# Patient Record
Sex: Male | Born: 1940 | Race: White | Hispanic: No | Marital: Married | Smoking: Current every day smoker
Health system: Southern US, Community
[De-identification: ages and names within clinical notes are randomized; demographics above are authoritative.]

## PROBLEM LIST (undated history)

## (undated) DIAGNOSIS — J45909 Unspecified asthma, uncomplicated: Secondary | ICD-10-CM

## (undated) DIAGNOSIS — R972 Elevated prostate specific antigen [PSA]: Secondary | ICD-10-CM

## (undated) DIAGNOSIS — E781 Pure hyperglyceridemia: Secondary | ICD-10-CM

## (undated) DIAGNOSIS — G8929 Other chronic pain: Secondary | ICD-10-CM

## (undated) DIAGNOSIS — G4730 Sleep apnea, unspecified: Secondary | ICD-10-CM

## (undated) DIAGNOSIS — M129 Arthropathy, unspecified: Secondary | ICD-10-CM

## (undated) DIAGNOSIS — I252 Old myocardial infarction: Secondary | ICD-10-CM

## (undated) DIAGNOSIS — I10 Essential (primary) hypertension: Secondary | ICD-10-CM

## (undated) DIAGNOSIS — J449 Chronic obstructive pulmonary disease, unspecified: Secondary | ICD-10-CM

## (undated) DIAGNOSIS — F17210 Nicotine dependence, cigarettes, uncomplicated: Secondary | ICD-10-CM

## (undated) DIAGNOSIS — C61 Malignant neoplasm of prostate: Secondary | ICD-10-CM

## (undated) DIAGNOSIS — M25561 Pain in right knee: Secondary | ICD-10-CM

## (undated) DIAGNOSIS — G4733 Obstructive sleep apnea (adult) (pediatric): Secondary | ICD-10-CM

## (undated) DIAGNOSIS — R918 Other nonspecific abnormal finding of lung field: Secondary | ICD-10-CM

## (undated) DIAGNOSIS — M1990 Unspecified osteoarthritis, unspecified site: Secondary | ICD-10-CM

## (undated) DIAGNOSIS — B029 Zoster without complications: Secondary | ICD-10-CM

## (undated) DIAGNOSIS — I700 Atherosclerosis of aorta: Secondary | ICD-10-CM

## (undated) DIAGNOSIS — M8580 Other specified disorders of bone density and structure, unspecified site: Secondary | ICD-10-CM

## (undated) DIAGNOSIS — I2510 Atherosclerotic heart disease of native coronary artery without angina pectoris: Secondary | ICD-10-CM

## (undated) HISTORY — DX: Zoster without complications: B02.9

## (undated) HISTORY — DX: Arthropathy, unspecified: M12.9

## (undated) HISTORY — DX: Elevated prostate specific antigen (PSA): R97.20

## (undated) HISTORY — PX: APPENDECTOMY: SHX54

## (undated) HISTORY — DX: Atherosclerosis of aorta: I70.0

## (undated) HISTORY — DX: Pure hyperglyceridemia: E78.1

## (undated) HISTORY — DX: Other specified disorders of bone density and structure, unspecified site: M85.80

## (undated) HISTORY — DX: Nicotine dependence, cigarettes, uncomplicated: F17.210

## (undated) HISTORY — DX: Old myocardial infarction: I25.2

## (undated) HISTORY — DX: Other nonspecific abnormal finding of lung field: R91.8

## (undated) HISTORY — DX: Atherosclerotic heart disease of native coronary artery without angina pectoris: I25.10

## (undated) HISTORY — DX: Unspecified osteoarthritis, unspecified site: M19.90

## (undated) HISTORY — DX: Pain in right knee: M25.561

## (undated) HISTORY — DX: Other chronic pain: G89.29

## (undated) HISTORY — DX: Chronic obstructive pulmonary disease, unspecified: J44.9

## (undated) HISTORY — DX: Essential (primary) hypertension: I10

## (undated) HISTORY — DX: Malignant neoplasm of prostate: C61

## (undated) HISTORY — DX: Sleep apnea, unspecified: G47.30

## (undated) HISTORY — DX: Obstructive sleep apnea (adult) (pediatric): G47.33

---

## 1998-06-15 ENCOUNTER — Ambulatory Visit: Admission: RE | Admit: 1998-06-15 | Discharge: 1998-06-15 | Payer: Self-pay | Admitting: Family Medicine

## 2000-08-12 DIAGNOSIS — I2510 Atherosclerotic heart disease of native coronary artery without angina pectoris: Secondary | ICD-10-CM

## 2000-08-12 HISTORY — DX: Atherosclerotic heart disease of native coronary artery without angina pectoris: I25.10

## 2000-09-15 ENCOUNTER — Encounter: Admission: RE | Admit: 2000-09-15 | Discharge: 2000-09-15 | Payer: Self-pay | Admitting: Family Medicine

## 2000-09-15 ENCOUNTER — Encounter: Payer: Self-pay | Admitting: Family Medicine

## 2001-07-12 HISTORY — PX: CORONARY ANGIOPLASTY WITH STENT PLACEMENT: SHX49

## 2001-08-11 ENCOUNTER — Inpatient Hospital Stay (HOSPITAL_COMMUNITY): Admission: EM | Admit: 2001-08-11 | Discharge: 2001-08-15 | Payer: Self-pay | Admitting: Emergency Medicine

## 2001-08-11 ENCOUNTER — Encounter: Payer: Self-pay | Admitting: Emergency Medicine

## 2001-09-01 ENCOUNTER — Encounter (HOSPITAL_COMMUNITY): Admission: RE | Admit: 2001-09-01 | Discharge: 2001-10-16 | Payer: Self-pay | Admitting: Cardiology

## 2008-11-01 ENCOUNTER — Encounter: Payer: Self-pay | Admitting: Cardiology

## 2008-11-01 ENCOUNTER — Ambulatory Visit: Payer: Self-pay | Admitting: Cardiology

## 2008-11-01 DIAGNOSIS — M2550 Pain in unspecified joint: Secondary | ICD-10-CM

## 2008-11-01 DIAGNOSIS — G8929 Other chronic pain: Secondary | ICD-10-CM | POA: Insufficient documentation

## 2008-11-01 DIAGNOSIS — F17200 Nicotine dependence, unspecified, uncomplicated: Secondary | ICD-10-CM

## 2008-11-01 DIAGNOSIS — Z8546 Personal history of malignant neoplasm of prostate: Secondary | ICD-10-CM

## 2008-11-01 DIAGNOSIS — I2510 Atherosclerotic heart disease of native coronary artery without angina pectoris: Secondary | ICD-10-CM | POA: Insufficient documentation

## 2008-11-01 DIAGNOSIS — I10 Essential (primary) hypertension: Secondary | ICD-10-CM

## 2008-11-01 DIAGNOSIS — E785 Hyperlipidemia, unspecified: Secondary | ICD-10-CM

## 2008-11-01 DIAGNOSIS — J449 Chronic obstructive pulmonary disease, unspecified: Secondary | ICD-10-CM

## 2008-11-03 ENCOUNTER — Telehealth (INDEPENDENT_AMBULATORY_CARE_PROVIDER_SITE_OTHER): Payer: Self-pay | Admitting: *Deleted

## 2008-11-07 ENCOUNTER — Ambulatory Visit: Payer: Self-pay

## 2008-11-07 ENCOUNTER — Encounter: Payer: Self-pay | Admitting: Internal Medicine

## 2008-11-21 ENCOUNTER — Telehealth: Payer: Self-pay | Admitting: Cardiology

## 2009-08-18 ENCOUNTER — Encounter (INDEPENDENT_AMBULATORY_CARE_PROVIDER_SITE_OTHER): Payer: Self-pay | Admitting: *Deleted

## 2010-09-11 NOTE — Letter (Signed)
Summary: Appointment - Reminder 2  [facility] [employer], Main Office  [address]   [place], [place] [address]   Phone: [phone]  Fax: [phone]     August 18, 2009 MRN: [id number]   GERMAIN KOOPMANN [address] [place], [place]  [address]   Dear Mr. Belmontes,  Our records indicate that it is time to schedule a follow-up appointment. Dr.Hochrein recommended that you follow up with [place] in March,2011. It is very important that we reach you to schedule this appointment. We look forward to participating in your health care needs. Please contact us at the number listed above at your earliest convenience to schedule your appointment.  If you are unable to make an appointment at this time, give [place] a call so we can update our records.     Sincerely,   Glass blower/designer

## 2010-12-28 NOTE — H&P (Signed)
[place]. [facility]  Patient:    Bruce Gordon, Bruce Gordon Visit Number: [id number] MRN: [id number]          Service Type: MED Location: [facility] 2921 01 Attending Physician:  Corliss Marcus Dictated by:   Francisca December, M.D. Admit Date:  08/11/2001   CC:         Dyanne Carrel, M.D.   History and Physical  REASON FOR ADMISSION:  Acute myocardial infarction.  HISTORY OF PRESENT ILLNESS:  Bruce Gordon is a 70 year old man who developed spontaneous onset of anterior substernal crushing and aching chest pain at around 11 a.m. this morning.  It was associated with perfuse diaphoresis, but no nausea, vomiting, or shortness of breath.  He had a friend drive him to the emergency room where ECG promptly revealed the presence of an acute anterolateral septal wall myocardial infarction.  Bruce Gordon has no previous cardiac history.  He did have chest discomfort which was mild, but similar to todays in location and quality on Sunday.  It lasted one-half hour and then resolved.  He thought it was indigestion.  He does not generally have symptoms of reflux, however.  His cardiac risk factors include age, sex, hypertension, and hyperlipidemia.  PAST MEDICAL HISTORY: 1. Hypertension. 2. Hyperlipidemia.  CURRENT MEDICATIONS:  Lipitor, doxazosin, and HCTZ, ? doses.  DRUG ALLERGIES:  None known.  FAMILY HISTORY:  Not significant for early coronary disease, but is present in family members over the age of 60.  SOCIAL HISTORY:  He works for a [employer].  He smokes cigarettes greater than one pack per day and has for many years.  He drinks no alcohol.  Caffeine intake is not excessive.  REVIEW OF SYSTEMS:  Noncontributory.  PHYSICAL EXAMINATION:  The blood pressure is 150/110, pulse 85 and regular, respiratory rate 16, and temperature afebrile.  GENERAL APPEARANCE:  This is an anxious 70 year old man in no acute distress.  HEENT:  Unremarkable.  The  head is normocephalic and atraumatic.  The pupils are equal, round, and reactive to light and accommodation.  Extraocular movements are intact.  The oral mucosa is pink and moist.  The tongue is not coated.  NECK:  Supple without thyromegaly or masses.  Carotid upstrokes are normal. There is no bruit.  There is no jugular venous distention.  CHEST:  Clear with adequate excursion.  Normal vesicular breath sounds are heard throughout.  HEART:  The precordium is quiet.  Normal S1 and S2.  No S3, murmur, click, or rub.  There is an S4 present.  The PMI is not palpable.  ABDOMEN:  Soft, flat, and nontender.  No hepatosplenomegaly or midline pulsatile mass.  GENITALIA:  Normal male phallus.  Descended testicles.  No lesions.  RECTAL:  Not performed.  EXTREMITIES:  Full range of motion.  No edema.  Intact distal pulses.  NEUROLOGIC:  Cranial nerves II-XII, motor, and sensory are grossly intact. Gait not tested.  SKIN:  Cool, dry, and clear.  LABORATORY DATA:  The electrocardiogram shows acute anteroseptal lateral myocardial infarction with reciprocal inferior changes and sinus rhythm.  ASSESSMENT:  Early into the course of a large anteroseptal lateral wall myocardial infarction.  The patient is hemodynamically stable.  PLAN:  The patient has been counseled on and accepted cardiac catheterization, coronary angiography, and possible PTCA for direct stent/revascularization. The goals and risks were reviewed.  The patient has had his questions answered and wishes to proceed.  He has consented to the Amaryl trial.  In the meantime, I agree with the ER management of IV nitroglycerin, heparin, and aspirin.  We will initiate beta blocker therapy early, as well as ACE inhibitor. Dictated by:   Francisca December, M.D. Attending Physician:  Corliss Marcus DD:  08/11/01 TD:  08/11/01 Job: [id number]

## 2010-12-28 NOTE — Discharge Summary (Signed)
[place]  Patient:    Bruce Gordon, Bruce Gordon Visit Number: [id number] MRN: [id number]          Service Type: MED Location: 2000 2029 01 Attending Physician:  Corliss Marcus Dictated by:   Anselm Lis, N.P. Admit Date:  08/11/2001 Disc. Date: 08/15/01   CC:         Dyanne Carrel, M.D.   Discharge Summary  PRIMARY CARE Danaka Llera:  Dyanne Carrel, M.D.  PROCEDURE:  (August 11, 2001) stent, mid left anterior descending.  Ejection fraction 47% with a large anterior apical wall motion abnormality and mild anterior apical dyskinesis, and mild anterolateral akinesis.  No significant mitral regurgitation.  DISCHARGE DIAGNOSES: 1. Coronary arteriosclerotic heart disease, single vessel:    a. anteroseptal and lateral myocardial infarction with peak CK of 2,424,       MB fraction 395;  enzymes tracking downwards at the time of discharge,    b. (August 11, 2001) percutaneous transluminal coronary angioplasty/stent       mid left anterior descending,    c. ischemic left ventricular dysfunction with ejection fraction of       approximately 33-35% (calculated 97%), large anterior apical wall motion       abnormality, mild anteroapical dyskinesis, anterior/lateral akinesis and       negative significant mitral regurgitation; and,    d. on aspirin, Plavix, beta blocker and ACE inhibitor. 2. Episodic sinus bradycardia/junctional escape rhythm with rate mid    30s to 40s, transient; probably related to post myocardial infarction    conduction disturbance. 3. History of dyslipidemia (on Lipitor prior to admission). 4. Leukocytosis with white blood cells elevated as high as 14.6 (from 7.7),    low-grade temperature initially, then subsequently afebrile, probably    stress response to myocardial infarction. 5. Tobacco abuse; smoking cessation counselling during course of admission;    patient attempting to quit cold [place].  Has had prior  successful attempt    lasting for 2.5 years in the past.  He has smoked one pack/day for 20+    years. 6. Systemic anticoagulation secondary to apical infarction with left    ventricular dysfunction; initiated on Coumadin during course of    admission.  His baseline coagulopathies were within normal range.    He is to use 10 mg of Coumadin on the 2nd and on the 03rd; anticipate    final discharge dosages to be determined by Dr. Katrinka Blazing at the time of    discharge.  He will come to our office on August 17, 2001 for prothrombin    time/INR and subsequent Coumadin adjustment pending those results.  PLAN:  It is anticipated the patient will be discharged on August 15, 2001 if condition stable.  DISCHARGE MEDICATIONS: 1. Aspirin, enteric coated, 82 mg p.o. q.d. (new). 2. Plavix 75 mg 1 p.o. q.d. (new). 3. Lisinopril 10 mg p.o. q.d. (new). 4. Toprol XL 50 mg p.o. q.d.  (new). 5. Warfarin as directed, 20 mg p.o. q.d. (new). 6. Lipitor 10 mg p.o. q.d. or as before. 7. Nitroglycerin tablet 1 sublingual q.5 minutes p.r.n. chest pain; up to    three tabs for 15 minutes (new). 8. Patient to hold doxazosin and hydrochlorothiazide for now.  ACTIVITY:  As per cardiac rehab.  No work for three to four weeks.  No driving.  DIET:  Low-fat, low-cholesterol; fat 40 grams/day or less and cholesterol 300 mg/day.  WOUND CARE:  Patient is to call our clinic if  he develops a large amount of swelling or bruising in the groin area.  SPECIAL INSTRUCTIONS:  Patient to come to our clinic August 17, 2001 for blood tests at 12:45 PM.  He will follow up with Dr. Amil Amen Thursday, August 27, 2001 at noon.  HISTORY OF PRESENT ILLNESS:  Mr. Bruce Gordon is a pleasant 70 year old gentleman with history of hypertension and dyslipidemia as well as tobacco abuse.  He had a spontaneous episode of anterior substernal crushing and aching chest pain at 11 AM the morning of admission, associated with  diffuse diaphoresis, but no nausea or vomiting, or shortness of breath.  A friend drove him to the emergency room where a prompt EKG revealed the presence of an acute anterolateral septal wall myocardial infarction.  He was taken urgently for coronary angiography with the following results: 1. Left ventriculogram:  Large anterior apical wall motion abnormality with    mild anteroapical dyskinesis and anterolateral akinesis.  Calculated    ejection fraction 47%.  Visually it appeared in the range of 30-35%.    Left coronary calcification seen.  No significant mitral regurgitation. 2. Coronary angiography:    a. RCA:  Dominant.  Branches without significant obstruction.  Twenty to       thirty percent mid-to-distal stenosis.  RCA gave rise to moderate size       posterior descending artery and a small posterolateral branch end       segment.    b. Left main:  Normal.    c. Left anterior descending:  LAD and its branches were highly diseased.       Vessel completely occluded in mid portion just after the origin of the       small septal perforator.  Spontaneous reperfusion during angiography       revealing a subtotal stenosis at the origin of the septal perforator and       a thrombus burden within this vessel approximately 2 mm beyond the near       complete occlusion.    d. CFX:  The CFX and its branches without significant obstruction.  Luminal       irregularities. 3. Procedure:  PTCA/stent mid LAD.  COMMENT:  Collateral vessels were seen from the distal PDA to the LAD, but they were quite trivial in nature.  Elevated left ventricular end diastolic pressure of 34 mmHg pre- and post ventriculogram.  Postprocedure patient was stable and remained so during the course of admission.  Peak CK was 2,424, MB fraction 395 and tracking downward serial enzymes.  He did have some episodic transient decreased heart rate (sinus brady/junctional escape rhythm), rate 30s to 40s.  Smoking  cessation  counselling was initiated.  Patient is hoping to quit "cold [place]."  He was started on Coumadin for anteroapical LV dysfunction.  During the course of admission he tolerated ambulation without complaint of chest pain nor shortness of breath.  It is anticipated he will follow up in cardiac rehab as an outpatient.  LABORATORY TESTS:  First CK of 142, MB fraction 4 and troponin I 0.16.  Second CK of 2,424, MB fraction 395.  Third CK of 1,678 with MB fraction 212.  Fourth CK of 975 with MB fraction 107.  Admission chest x-ray revealed mildly accentuated bronchial markings and no active disease.  Sodium 137, K 3.4 subsequently 3.3 and on January 01st was 3.9, chloride 106, CO2 26, glucose 109, BUN of 14, creatinine 1.1.  LFTs all within normal range.  WBC of  7.7 subsequently 14.6, hemoglobin of 16.1 subsequently 14.3, hematocrit of 46.7 subsequently 41.5, and platelets of 242,000.  Admission coags were within normal range.  Admission EKG revealed acute anteroseptal lateral myocardial infarction with reciprocal inferior changes and sinus rhythm. Dictated by:   Anselm Lis, N.P. Attending Physician:  Corliss Marcus DD:  08/14/01 TD:  08/14/01 Job: [id number]

## 2010-12-28 NOTE — Cardiovascular Report (Signed)
[place]. [facility]  Patient:    Bruce Gordon, Bruce Gordon Visit Number: [id number] MRN: [id number]          Service Type: MED Location: [facility] 2921 01 Attending Physician:  Corliss Marcus Dictated by:   Francisca December, M.D. Proc. Date: 08/11/01 Admit Date:  08/11/2001   CC:         Dyanne Carrel, M.D.  Cardiac Catheterization Laboratory   Cardiac Catheterization  PROCEDURES PERFORMED: 1. Left heart catheterization. 2. Coronary angiography. 3. Left ventriculogram. 4. Percutaneous transluminal coronary angiography and stent implantation to    mid left anterior descending artery.  INDICATIONS:  Bruce Gordon is a 70 year old man who is now 1-1/2 to 2 hours into the course of an acute anterolateral wall myocardial infarction.  He is brought to the cardiac catheterization laboratory for possible percutaneous intervention.  PROCEDURAL NOTE:  Left heart catheterization was performed following the percutaneous insertion of a 7-French catheter sheath utilizing an anterior approach over a guiding J-wire into the right femoral artery.  A 110 cm pigtail catheter was used to measure pressures in the ascending aorta and in the left ventricle both prior to and following the ventriculogram.  A 30 degree RAO cine left ventriculogram was performed utilizing a power injector. Coronary angiography was then performed using 6-French #4 left and right Judkins catheters.  Cineangiography of each coronary artery was conducted in multiple LAO and RAO projections.  At the completion of the procedure, we made preparations for percutaneous coronary intervention.  INTERVENTIONAL PROCEDURE:  The 6-French left Judkins catheter was exchanged for a 7-French #4 FL guiding catheter.  This was advanced to the ascending aorta where the left coronary os was engaged.  An ACT was obtained and found to be 212 seconds.  The patient received 2000 units of additional heparin.  He also  received a bolus of ReoPro and constant infusion.  A 0.014 inch Sci-Med luge intracoronary guide wire was passed across the lesion in the mid LAD with minimal difficulty.  Initial stent implantation was performed utilizing a 3.5 x 16 mm Sci-Med Express-II intracoronary stent. This was carefully positioned across the lesion in the mid LAD.  It was deployed there at a peak pressure of 20 atm for one minute.  The balloon was then removed.  Cineangiography repeated and a 4.0 x 10 mm Sci-Med [place] Ranger intracoronary balloon was then advanced into the mid portion of the stent.  It was inflated there to a peak pressure of 18 atm for 52 seconds.  Following confirmation of adequate patency in orthogonal views both with and without the guide wire in place, the guiding catheter was removed.  Hemostasis was achieved by suturing the sheath in place.  The patient was transported to the recovery area in stable condition with an intact distal pulse.  ACT at closure was 254 seconds.  HEMODYNAMICS:  Systemic arterial pressure was 165/110 with a mean of 137 mmHg. There was no systolic gradient across the aortic valve.  The left ventricular end-diastolic pressure was 34 mmHg pre and post ventriculogram.  The patient did receive 10 mg of IV Metoprolol during the procedure and his blood pressure fell at completion to 130/90 mmHg with a heart rate of 75.  ANGIOGRAPHY:  The left ventriculogram demonstrated a large anteroapical wall motion abnormality with mild anteroapical dyskinesis and anterolateral akinesis.  The calculated ejection fraction was 47%.  Visually, it appeared in the range of 30-35%.  Left coronary calcification was seen.  There was no  significant mitral regurgitation.  There was a right dominant coronary system present.  The main left coronary artery was normal.  The left anterior descending artery and its branches were highly diseased. The vessel was completely occluded in the mid portion  just after the origin of a small septal perforator.  There was spontaneous reperfusion during angiography and this revealed a subtotal stenosis at the origin of the septal perforator and a thrombus burden within the vessel approximately 2 mm beyond the near complete occlusion.  There was TIMI-1 flow at this point.  The left circumflex coronary artery and its branches were without significant obstruction.  There were luminal irregularities.  The right coronary artery and its branches were without significant obstruction.  There was a 20-30% mid to distal stenosis.  It gave rise to a moderate sized posterior descending artery and a small posterolateral branch and segment.  Collateral vessels were seen from the distal PDA to the LAD, but these were quite trivial in nature.  Following balloon dilatation and stent implantation, there was no residual stenosis in the LAD and TIMI grade flow was II.  FINAL IMPRESSIONS: 1. Atherosclerotic coronary vascular disease, single-vessel. 2. Moderately diminished left ventricular systolic function with regional    wall motion abnormality, as noted. 3. Status post successful percutaneous transluminal coronary angiography and    stent implantation to mid left anterior descending artery. 4. Systemic hypertension. 5. Elevated left ventricular end-diastolic pressure. Dictated by:   Francisca December, M.D. Attending Physician:  Corliss Marcus DD:  08/11/01 TD:  08/11/01 Job: [id number]

## 2014-03-25 ENCOUNTER — Ambulatory Visit: Payer: Self-pay | Admitting: Family Medicine

## 2014-06-20 ENCOUNTER — Ambulatory Visit: Payer: Self-pay | Admitting: Radiation Oncology

## 2014-07-12 ENCOUNTER — Ambulatory Visit: Payer: Self-pay | Admitting: Radiation Oncology

## 2014-08-03 LAB — CBC CANCER CENTER
Basophil #: 0 x10 3/mm (ref 0.0–0.1)
Basophil %: 0.3 %
Eosinophil #: 0.1 x10 3/mm (ref 0.0–0.7)
Eosinophil %: 1.4 %
HCT: 46.3 % (ref 40.0–52.0)
HGB: 15.6 g/dL (ref 13.0–18.0)
Lymphocyte #: 2.1 x10 3/mm (ref 1.0–3.6)
Lymphocyte %: 24.3 %
MCH: 33.2 pg (ref 26.0–34.0)
MCHC: 33.6 g/dL (ref 32.0–36.0)
MCV: 99 fL (ref 80–100)
Monocyte #: 1.1 x10 3/mm — ABNORMAL HIGH (ref 0.2–1.0)
Monocyte %: 13 %
Neutrophil #: 5.2 x10 3/mm (ref 1.4–6.5)
Neutrophil %: 61 %
Platelet: 168 x10 3/mm (ref 150–440)
RBC: 4.68 10*6/uL (ref 4.40–5.90)
RDW: 14.2 % (ref 11.5–14.5)
WBC: 8.5 x10 3/mm (ref 3.8–10.6)

## 2014-08-10 LAB — CBC CANCER CENTER
Basophil #: 0 x10 3/mm (ref 0.0–0.1)
Basophil %: 0.3 %
Eosinophil #: 0.3 x10 3/mm (ref 0.0–0.7)
Eosinophil %: 3.5 %
HCT: 46.1 % (ref 40.0–52.0)
HGB: 15.7 g/dL (ref 13.0–18.0)
Lymphocyte #: 1.5 x10 3/mm (ref 1.0–3.6)
Lymphocyte %: 19.2 %
MCH: 33.6 pg (ref 26.0–34.0)
MCHC: 34 g/dL (ref 32.0–36.0)
MCV: 99 fL (ref 80–100)
Monocyte #: 0.9 x10 3/mm (ref 0.2–1.0)
Monocyte %: 12 %
Neutrophil #: 5 x10 3/mm (ref 1.4–6.5)
Neutrophil %: 65 %
Platelet: 202 x10 3/mm (ref 150–440)
RBC: 4.67 10*6/uL (ref 4.40–5.90)
RDW: 14.5 % (ref 11.5–14.5)
WBC: 7.6 x10 3/mm (ref 3.8–10.6)

## 2014-08-12 ENCOUNTER — Ambulatory Visit: Payer: Self-pay | Admitting: Radiation Oncology

## 2014-08-17 LAB — CBC CANCER CENTER
Basophil #: 0 x10 3/mm (ref 0.0–0.1)
Basophil %: 0.4 %
Eosinophil #: 0.3 x10 3/mm (ref 0.0–0.7)
Eosinophil %: 4.4 %
HCT: 46.2 % (ref 40.0–52.0)
HGB: 15.6 g/dL (ref 13.0–18.0)
Lymphocyte #: 1.5 x10 3/mm (ref 1.0–3.6)
Lymphocyte %: 25.7 %
MCH: 33.6 pg (ref 26.0–34.0)
MCHC: 33.8 g/dL (ref 32.0–36.0)
MCV: 99 fL (ref 80–100)
Monocyte #: 0.7 x10 3/mm (ref 0.2–1.0)
Monocyte %: 12.8 %
Neutrophil #: 3.3 x10 3/mm (ref 1.4–6.5)
Neutrophil %: 56.7 %
Platelet: 211 x10 3/mm (ref 150–440)
RBC: 4.66 10*6/uL (ref 4.40–5.90)
RDW: 14.7 % — ABNORMAL HIGH (ref 11.5–14.5)
WBC: 5.7 x10 3/mm (ref 3.8–10.6)

## 2014-08-24 LAB — CBC CANCER CENTER
Basophil #: 0 x10 3/mm (ref 0.0–0.1)
Basophil %: 0.6 %
Eosinophil #: 0.3 x10 3/mm (ref 0.0–0.7)
Eosinophil %: 4.4 %
HCT: 47.3 % (ref 40.0–52.0)
HGB: 15.9 g/dL (ref 13.0–18.0)
Lymphocyte #: 1.4 x10 3/mm (ref 1.0–3.6)
Lymphocyte %: 23.9 %
MCH: 33.4 pg (ref 26.0–34.0)
MCHC: 33.7 g/dL (ref 32.0–36.0)
MCV: 99 fL (ref 80–100)
Monocyte #: 0.8 x10 3/mm (ref 0.2–1.0)
Monocyte %: 14.4 %
Neutrophil #: 3.3 x10 3/mm (ref 1.4–6.5)
Neutrophil %: 56.7 %
Platelet: 190 x10 3/mm (ref 150–440)
RBC: 4.76 10*6/uL (ref 4.40–5.90)
RDW: 14.5 % (ref 11.5–14.5)
WBC: 5.8 x10 3/mm (ref 3.8–10.6)

## 2014-08-31 LAB — CBC CANCER CENTER
Basophil #: 0 x10 3/mm (ref 0.0–0.1)
Basophil %: 0.4 %
Eosinophil #: 0.2 x10 3/mm (ref 0.0–0.7)
Eosinophil %: 3.7 %
HCT: 48.5 % (ref 40.0–52.0)
HGB: 16.3 g/dL (ref 13.0–18.0)
Lymphocyte #: 1.5 x10 3/mm (ref 1.0–3.6)
Lymphocyte %: 23 %
MCH: 33.4 pg (ref 26.0–34.0)
MCHC: 33.7 g/dL (ref 32.0–36.0)
MCV: 99 fL (ref 80–100)
Monocyte #: 1 x10 3/mm (ref 0.2–1.0)
Monocyte %: 14.8 %
Neutrophil #: 3.8 x10 3/mm (ref 1.4–6.5)
Neutrophil %: 58.1 %
Platelet: 182 x10 3/mm (ref 150–440)
RBC: 4.89 10*6/uL (ref 4.40–5.90)
RDW: 14.6 % — ABNORMAL HIGH (ref 11.5–14.5)
WBC: 6.6 x10 3/mm (ref 3.8–10.6)

## 2014-09-07 LAB — CBC CANCER CENTER
Basophil #: 0 x10 3/mm (ref 0.0–0.1)
Basophil %: 0.5 %
Eosinophil #: 0.3 x10 3/mm (ref 0.0–0.7)
Eosinophil %: 5 %
HCT: 44.3 % (ref 40.0–52.0)
HGB: 15 g/dL (ref 13.0–18.0)
Lymphocyte #: 1.3 x10 3/mm (ref 1.0–3.6)
Lymphocyte %: 24.4 %
MCH: 33.8 pg (ref 26.0–34.0)
MCHC: 33.8 g/dL (ref 32.0–36.0)
MCV: 100 fL (ref 80–100)
Monocyte #: 0.8 x10 3/mm (ref 0.2–1.0)
Monocyte %: 15.1 %
Neutrophil #: 2.9 x10 3/mm (ref 1.4–6.5)
Neutrophil %: 55 %
Platelet: 164 x10 3/mm (ref 150–440)
RBC: 4.44 10*6/uL (ref 4.40–5.90)
RDW: 15.1 % — ABNORMAL HIGH (ref 11.5–14.5)
WBC: 5.3 x10 3/mm (ref 3.8–10.6)

## 2014-09-12 ENCOUNTER — Ambulatory Visit: Payer: Self-pay | Admitting: Radiation Oncology

## 2014-10-11 ENCOUNTER — Ambulatory Visit
Admit: 2014-10-11 | Disposition: A | Discharge status: Home / Self Care | Payer: Self-pay | Attending: Radiation Oncology | Admitting: Radiation Oncology

## 2014-12-03 NOTE — Consult Note (Signed)
Reason for Visit: This 74 year old Male patient presents to the clinic for initial evaluation of  prostate cancer .   Referred by Dr. Erlene Quan.  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  Pathology Report pathology report reviewed   Imaging Report based on low PSA bone scan not indicated   Referral Report clinical notes reviewed   Planned Treatment Regimen IMRTimage guided radiation therapy   HPI   patient's a pleasant 74 year old male followed by urology for slightly elevated PSA of 5.3. He was noted to have induration of his right lateral lobe of his prostate with a 36 ml prostate volume. Patient underwent transrectal ultrasound-guided biopsy showing 3 core biopsies positive in the right apex and right lateral gland for mostly Gleason 7 (3+4) and Gleason 6 (3+3) adenocarcinoma. He has very little urinary symptoms has nocturia 1Pacific urgency or frequency. 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 He specifically denies bone pain hematuria or incontinence. Treatment options have been discussed with the patient. He has multiple comorbidities including coronary artery disease with stents placed prior myocardial infarction. He is seen today for treatment options.  Past Hx:    Arthritis:    CAD:    Prostate Cancer:    MI - Myocardial Infarct:    HTN:    Hyperlipidemia:   Past, Family and Social History:  Past Medical History positive   Cardiovascular coronary artery disease; hyperlipidemia; hypertension; myocardial infarction   Past Medical History Comments arthritis   Family History positive   Family History Comments father with prostate cancer   Social History positive   Social History Comments 40-pack-year smoking history continues to smoke a pack a day no EtOH abuse history   Additional Past Medical and Surgical History accompanied by his wife today    Allergies:   No Known Allergies:   Home Meds:  Home Medications: Medication Instructions Status  Plavix 75 mg oral tablet 1 tab(s) orally once a day Active  Niaspan ER 1000 mg oral tablet, extended release 1 tab(s) orally once a day (at bedtime) Active  Metoprolol Tartrate 100 mg oral tablet 1 tab(s) orally 2 times a day Active  aspirin 81 mg oral tablet 1 tab(s) orally once a day Active  Mobic 15 mg oral tablet 1 tab(s) orally once a day Active  Tylenol 325 mg oral tablet 1 tab(s) orally once a day Active  amLODIPine 5 mg oral tablet 1 tab(s) orally once a day Active  Crestor 10 mg oral tablet 1 tab(s) orally once a day (at bedtime) Active  KlonoPIN 1 mg oral tablet 1 tab(s) orally once a day Active  lisinopril 40 mg oral tablet 1 tab(s) orally once a day Active  acetaminophen-oxyCODONE 300 mg-5 mg oral tablet 1 tab(s) orally every 6 hours, As Needed Active  Spiriva Respimat 2.5 mcg/inh inhalation aerosol 2 puff(s) inhaled once a day Active   Review of Systems:  General negative   Performance Status (ECOG) 0   Skin negative   Breast negative   Ophthalmologic negative   ENMT negative   Respiratory and Thorax negative   Cardiovascular negative   Gastrointestinal negative   Genitourinary negative   Musculoskeletal negative   Neurological negative   Psychiatric negative   Hematology/Lymphatics negative   Endocrine negative   Allergic/Immunologic negative   Review of Systems   denies any weight loss, fatigue, weakness, fever, chills or night sweats. Patient  denies any loss of vision, blurred vision. Patient denies any ringing  of the ears or hearing loss. No irregular heartbeat. Patient denies heart murmur or history of fainting. Patient denies any chest pain or pain radiating to her upper extremities. Patient denies any shortness of breath, difficulty breathing at night, cough or hemoptysis. Patient denies any swelling in the lower legs. Patient denies any  nausea vomiting, vomiting of blood, or coffee ground material in the vomitus. Patient denies any stomach pain. Patient states has had normal bowel movements no significant constipation or diarrhea. Patient denies any dysuria, hematuria or significant nocturia. Patient denies any problems walking, swelling in the joints or loss of balance. Patient denies any skin changes, loss of hair or loss of weight. Patient denies any excessive worrying or anxiety or significant depression. Patient denies any problems with insomnia. Patient denies excessive thirst, polyuria, polydipsia. Patient denies any swollen glands, patient denies easy bruising or easy bleeding. Patient denies any recent infections, allergies or URI. Patient "s visual fields have not changed significantly in recent time.   Nursing Notes:  Nursing Vital Signs and Chemo Nursing Nursing Notes: *CC Vital Signs Flowsheet:   09-Nov-15 13:38  Temp Temperature 96.7  Pulse Pulse 87  Respirations Respirations 20  SBP SBP 152  DBP DBP 90  Pain Scale (0-10)  0  Current Weight (kg) (kg) 85.8   Physical Exam:  General/Skin/HEENT:  General normal   Skin normal   Eyes normal   ENMT normal   Head and Neck normal   Additional PE well-developed male in NAD. Lungs are clear to A&P cardiac examination shows regular rate and rhythm abdomen is benign. On rectal exam rectal sphincter tone is good. There is some asymmetry to his prostate with firmness in the right lateral lobe although the sulcus is preserved bilaterally. Seminal vesicle regions appear within normal limits. No other rectal abnormality is identified.   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 Plan:   at this time of gone over treatment options with the patient including surgical resection versus seed implantation versus external beam IM RT radiation. Patient is in favor of external beam treatment. I will plan on delivering 8000 cGy over 8 weeks to his prostate. I have asked Dr. Erlene Quan to place gold fiduciary markers in his prostate for daily image guided treatment. Shortly after marker placement I have set him up for CT simulation. Risks and benefits of treatment including increased urinary frequency urgency nocturia, possible diarrhea, possible alteration of blood counts, possible fatigue or were explained in detail to the patient. He seems to comprehend my treatment plan well. I have arrange for simulation as well as marker placement. Patient knows to call with any concerns in the near future.  I would like to take this opportunity for allowing me to participate in the care of your patient..  Fax to Physician:  [facility] Fax: Bobetta Lime, MD - [phone] Sherlynn Stalls - [phone].  Electronic Signatures: Armstead Peaks (MD)  (Signed [phone] 15:20)  Authored: HPI, Diagnosis, Past Hx, PFSH, Allergies, Home Meds, ROS, Nursing Notes, Physical Exam, Encounter Assessment and Plan, Fax to Physician   Last Updated: 09-Nov-15 15:20 by  Jalina Blowers, Roda Shutters (MD)

## 2015-01-17 ENCOUNTER — Other Ambulatory Visit: Payer: Self-pay | Admitting: Family Medicine

## 2015-01-17 DIAGNOSIS — I10 Essential (primary) hypertension: Secondary | ICD-10-CM

## 2015-01-17 DIAGNOSIS — J449 Chronic obstructive pulmonary disease, unspecified: Secondary | ICD-10-CM

## 2015-02-02 ENCOUNTER — Other Ambulatory Visit: Payer: Self-pay | Admitting: Family Medicine

## 2015-02-02 DIAGNOSIS — I10 Essential (primary) hypertension: Secondary | ICD-10-CM

## 2015-02-10 ENCOUNTER — Other Ambulatory Visit: Payer: Self-pay | Admitting: Family Medicine

## 2015-02-10 DIAGNOSIS — E785 Hyperlipidemia, unspecified: Secondary | ICD-10-CM

## 2015-02-25 ENCOUNTER — Other Ambulatory Visit: Payer: Self-pay | Admitting: Family Medicine

## 2015-02-25 DIAGNOSIS — I2510 Atherosclerotic heart disease of native coronary artery without angina pectoris: Secondary | ICD-10-CM

## 2015-02-25 DIAGNOSIS — I10 Essential (primary) hypertension: Secondary | ICD-10-CM

## 2015-03-06 ENCOUNTER — Encounter: Payer: Self-pay | Admitting: Radiation Oncology

## 2015-03-06 ENCOUNTER — Inpatient Hospital Stay: Payer: Medicare Other | Attending: Radiation Oncology

## 2015-03-06 ENCOUNTER — Ambulatory Visit
Admission: RE | Admit: 2015-03-06 | Discharge: 2015-03-06 | Disposition: A | Discharge status: Home / Self Care | Payer: Medicare Other | Source: Ambulatory Visit | Attending: Radiation Oncology | Admitting: Radiation Oncology

## 2015-03-06 ENCOUNTER — Other Ambulatory Visit: Payer: Self-pay | Admitting: *Deleted

## 2015-03-06 VITALS — BP 141/82 | HR 81 | Temp 98.0°F | Wt 185.0 lb

## 2015-03-06 DIAGNOSIS — C61 Malignant neoplasm of prostate: Secondary | ICD-10-CM

## 2015-03-06 HISTORY — DX: Malignant neoplasm of prostate: C61

## 2015-03-06 LAB — PSA: PSA: 0.76 ng/mL (ref 0.00–4.00)

## 2015-03-06 NOTE — Progress Notes (Signed)
Radiation Oncology Follow up Note  Name: Bruce Gordon   Date:   03/06/2015 MRN:  [id number] DOB: 1941-03-30    This 74 y.o. male presents to the clinic today for follow-up for adenocarcinoma the prostate.status post IM RT radiation therapy 6 months prior  REFERRING PROVIDER: No ref. provider found  HPI: patient is a 74 year old male now out 6 months having completed IM RT treatment planning and delivery for a Gleason 7.(3+4) presenting the PSA 5.3. His clinical stage was T2 1 N0 M0). He has done well since his external beam radiation treatments. He specifically denies any increasing lower urinary tract symptoms or diarrhea. He has not yet had a PSA drawn.  COMPLICATIONS OF TREATMENT: none  FOLLOW UP COMPLIANCE: keeps appointments   PHYSICAL EXAM:  BP 141/82 mmHg  Pulse 81  Temp(Src) 98 F (36.7 C)  Wt 184 lb 15.5 oz (83.9 kg) On rectal exam rectal sphincter tone is good. Prostate is smooth contracted without evidence of nodularity or mass. Sulcus is preserved bilaterally. No discrete nodularity is identified. No other rectal abnormalities are noted. Well-developed well-nourished patient in NAD. HEENT reveals PERLA, EOMI, discs not visualized.  Oral cavity is clear. No oral mucosal lesions are identified. Neck is clear without evidence of cervical or supraclavicular adenopathy. Lungs are clear to A&P. Cardiac examination is essentially unremarkable with regular rate and rhythm without murmur rub or thrill. Abdomen is benign with no organomegaly or masses noted. Motor sensory and DTR levels are equal and symmetric in the upper and lower extremities. Cranial nerves II through XII are grossly intact. Proprioception is intact. No peripheral adenopathy or edema is identified. No motor or sensory levels are noted. Crude visual fields are within normal range.   RADIOLOGY RESULTS: no recent films for review  PLAN: at the present time symptomatically he is doing extremely well. I'm running a  PSA level on him today and will report that separately. I've asked to see him back in 6 months for follow-up at which time I'll obtain another PSA if it is not already been performed. Patient knows to call with any problems or concerns. I am again overall pleased with his performance status. And overall lack of significant side effects. I would like to take this opportunity for allowing me to participate in the care of your patient.Armstead Peaks., MD

## 2015-03-20 ENCOUNTER — Encounter: Payer: Self-pay | Admitting: Family Medicine

## 2015-03-20 ENCOUNTER — Ambulatory Visit (INDEPENDENT_AMBULATORY_CARE_PROVIDER_SITE_OTHER): Payer: Medicare Other | Admitting: Family Medicine

## 2015-03-20 VITALS — BP 112/60 | HR 83 | Temp 98.4°F | Resp 16 | Wt 186.1 lb

## 2015-03-20 DIAGNOSIS — G8929 Other chronic pain: Secondary | ICD-10-CM

## 2015-03-20 DIAGNOSIS — Z955 Presence of coronary angioplasty implant and graft: Secondary | ICD-10-CM | POA: Diagnosis not present

## 2015-03-20 DIAGNOSIS — G4730 Sleep apnea, unspecified: Secondary | ICD-10-CM | POA: Insufficient documentation

## 2015-03-20 DIAGNOSIS — I252 Old myocardial infarction: Secondary | ICD-10-CM

## 2015-03-20 DIAGNOSIS — I2510 Atherosclerotic heart disease of native coronary artery without angina pectoris: Secondary | ICD-10-CM

## 2015-03-20 DIAGNOSIS — M2550 Pain in unspecified joint: Secondary | ICD-10-CM | POA: Diagnosis not present

## 2015-03-20 DIAGNOSIS — E785 Hyperlipidemia, unspecified: Secondary | ICD-10-CM

## 2015-03-20 DIAGNOSIS — I10 Essential (primary) hypertension: Secondary | ICD-10-CM | POA: Diagnosis not present

## 2015-03-20 MED ORDER — LISINOPRIL 20 MG PO TABS: 20.0000 mg | ORAL_TABLET | Freq: Every day | ORAL | Status: DC

## 2015-03-20 MED ORDER — HYDROCODONE-ACETAMINOPHEN 5-325 MG PO TABS: 1.0000 | ORAL_TABLET | Freq: Two times a day (BID) | ORAL | Status: DC | PRN

## 2015-03-20 NOTE — Progress Notes (Signed)
Name: Bruce Gordon   MRN: [id number]    DOB: 1941/03/01   Date:03/20/2015       Progress Note  Subjective  Chief Complaint  Chief Complaint  Patient presents with  . Medication Refill    HPI  Patient is here for routine follow up of Hypertension. First diagnosed with hypertension several years ago. Current anti-hypertension medication regimen includes dietary modification, weight management and Toprol XL 25mg  a day, Lisinopril 40mg  a day.  Recently amlodipine was discontinued due to low BPs. Patient is following physician recommended management. Checking blood pressure outside of physician office. Results average systolic 68-341 and average diastolic 9-62. Associated symptoms do not include headache, dizziness, nausea, lower extremity swelling, shortness of breath, chest pain, numbness.  Complicated medical picture, related diagnosis include CAD with 1 stent in place (unknown which vessel), history of MI, continues to smoke, COPD, sleep apnea.   Joint/Muscle Pain: Patient complains of arthralgias for which has been present for several years. Pain is located in bilateral knees and lower back, is described as aching and throbbing, and is constant .  Associated symptoms include: decreased range of motion.  The patient has currently using hydrocodone-acetoaminophen which brings good relief. Requesting refill today.  Past Medical History  Diagnosis Date  . Prostate cancer   . Primary prostate cancer   . Cigarette smoker   . Obstructive sleep apnea of adult   . Sleep apnea   . HTN, goal below 150/90   . 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 History of myocardial infarction   . COPD, severe   . Arthritis, multiple joint involvement   . Arthritis   . Chronic pain of right knee   . Hypertriglyceridemia   . Herpes zoster without complication     Past Surgical History  Procedure Laterality Date  . Appendectomy    .  Coronary angioplasty with stent placement  December 2002    Family History  Problem Relation Age of Onset  . Hypertension Father   . Aneurysm Brother      Active Ambulatory Problems    Diagnosis Date Noted  . Hyperlipidemia LDL goal <70 11/01/2008  . Chronic pain of multiple joints 11/01/2008  . Tobacco use disorder 11/01/2008  . Hypertension goal BP (blood pressure) < 150/90 11/01/2008  . Coronary artery disease involving native coronary 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 angina pectoris 11/01/2008  . COPD, moderate 11/01/2008  . History of prostate cancer 11/01/2008  . Presence of stent in coronary artery 03/20/2015  . History of myocardial infarction 03/20/2015  . Sleep apnea in adult 03/20/2015   Resolved Ambulatory Problems    Diagnosis Date Noted  . No Resolved Ambulatory Problems   Past Medical History  Diagnosis Date  . Prostate cancer   . Primary prostate cancer   . Cigarette smoker   . Obstructive sleep apnea of adult   . Sleep apnea   . HTN, goal below 150/90   . COPD, severe   . Arthritis, multiple joint involvement   . Arthritis   . Chronic pain of right knee   . Hypertriglyceridemia   . Herpes zoster without complication      History   Social History  . Marital Status: Married    Spouse Name: N/A  . Number of Children: N/A  . Years of Education: N/A   Occupational History  . Not on file.   Social History Main Topics  . Smoking status: Current Every  Day Smoker  . Smokeless tobacco: Never Used  . Alcohol Use: No  . Drug Use: No  . Sexual Activity: Not on file   Other Topics Concern  . Not on file   Social History Narrative     Current outpatient prescriptions:  .  HYDROcodone-acetaminophen (NORCO/VICODIN) 5-325 MG per tablet, Take 1 tablet by mouth 2 (two) times daily as needed., Disp: 50 tablet, Rfl: 0 .  aspirin 81 MG tablet, Take 81 mg by mouth daily., Disp: , Rfl:  .  clopidogrel (PLAVIX) 75 MG tablet, TAKE 1 TABLET BY MOUTH EVERY  DAY, Disp: 90 tablet, Rfl: 3 .  lisinopril (PRINIVIL,ZESTRIL) 20 MG tablet, Take 1 tablet (20 mg total) by mouth daily., Disp: 90 tablet, Rfl: 3 .  metoprolol succinate (TOPROL-XL) 25 MG 24 hr tablet, TAKE 1 TABLET EVERY DAY, Disp: 90 tablet, Rfl: 1 .  niacin (NIASPAN) 1000 MG CR tablet, TAKE 1 TABLET EVERY DAY, Disp: 90 tablet, Rfl: 1 .  tiotropium (SPIRIVA HANDIHALER) 18 MCG inhalation capsule, Place 1 capsule (18 mcg total) into inhaler and inhale daily., Disp: 90 capsule, Rfl: 2  Not on File   ROS  CONSTITUTIONAL: No significant weight changes, fever, chills, weakness or fatigue.  HEENT:  - Eyes: No visual changes.  - Ears: No auditory changes. No pain.  - Nose: No sneezing, congestion, runny nose. - Throat: No sore throat. No changes in swallowing. SKIN: No rash or itching.  CARDIOVASCULAR: No chest pain, chest pressure or chest discomfort. No palpitations or edema.  RESPIRATORY: No shortness of breath, cough or sputum.  GASTROINTESTINAL: No anorexia, nausea, vomiting. No changes in bowel habits. No abdominal pain or blood.  GENITOURINARY: No dysuria. No frequency. No discharge.  NEUROLOGICAL: No headache, dizziness, syncope, paralysis, ataxia, numbness or tingling in the extremities. No memory changes. No change in bowel or bladder control.  MUSCULOSKELETAL: No joint pain. No muscle pain. HEMATOLOGIC: No anemia, bleeding or bruising.  LYMPHATICS: No enlarged lymph nodes.  PSYCHIATRIC: No change in mood. No change in sleep pattern.  ENDOCRINOLOGIC: No reports of sweating, cold or heat intolerance. No polyuria or polydipsia.   Objective  Filed Vitals:   03/20/15 1515  BP: 112/60  Pulse: 83  Temp: 98.4 F (36.9 C)  TempSrc: Oral  Resp: 16  Weight: 186 lb 1.6 oz (84.414 kg)  SpO2: 95%   Body mass index is 28.3 kg/(m^2).  Physical Exam  Constitutional: Patient appears well-developed and well-nourished. In no distress. Smells of cigarette smoke. Thin wrinkled skin with  scattered bruising on arms. HEENT:  - Head: Normocephalic and atraumatic.  - Ears: Bilateral TMs gray, no erythema or effusion - Nose: Nasal mucosa moist - Mouth/Throat: Oropharynx is clear and moist. No tonsillar hypertrophy or erythema. No post nasal drainage.  - Eyes: Conjunctivae clear, EOM movements normal. PERRLA. No scleral icterus.  Neck: Normal range of motion. Neck supple. No JVD present. No thyromegaly present.  Cardiovascular: Normal rate, regular rhythm and normal heart sounds.  2/6 SEM. Pulmonary/Chest: Effort normal and breath sounds decreased. No respiratory distress. Musculoskeletal: Normal range of motion bilateral UE and LE, no joint effusions. Peripheral vascular: Bilateral LE no edema. Neurological: CN II-XII grossly intact with no focal deficits. Alert and oriented to person, place, and time. Coordination, balance, strength, speech and gait are normal.  Skin: Skin is warm and dry. No rash noted. No erythema.  Psychiatric: Patient has a normal mood and affect. Behavior is normal in office today. Judgment and thought content normal in  office today.   Assessment & Plan  1. Hypertension goal BP (blood pressure) < 150/90 Low BPs recorded at home. Will reduce Lisinopril from 40mg  to 20mg . Continue Toprol XL at 25mg  a day.  - lisinopril (PRINIVIL,ZESTRIL) 20 MG tablet; Take 1 tablet (20 mg total) by mouth daily.  Dispense: 90 tablet; Refill: 3  2. Presence of stent in coronary artery Encouraged patient to consider establishing with a new Cardiologist but he has declined for now.  3. Coronary artery disease involving native coronary 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 angina pectoris Continue medical management.  4. Hyperlipidemia LDL goal <70 Not on statin therapy, intolerant?   5. Chronic pain of multiple joints Refilled.   - HYDROcodone-acetaminophen (NORCO/VICODIN) 5-325 MG per tablet; Take 1 tablet by mouth 2 (two) times daily as needed.  Dispense: 50 tablet; Refill:  0  6. History of myocardial infarction Increased risk for further cerebrovascular events.

## 2015-04-12 ENCOUNTER — Encounter: Payer: Self-pay | Admitting: Urology

## 2015-04-13 ENCOUNTER — Ambulatory Visit: Payer: Self-pay

## 2015-04-19 ENCOUNTER — Ambulatory Visit (INDEPENDENT_AMBULATORY_CARE_PROVIDER_SITE_OTHER): Payer: Medicare Other

## 2015-04-19 DIAGNOSIS — Z23 Encounter for immunization: Secondary | ICD-10-CM

## 2015-04-25 ENCOUNTER — Ambulatory Visit (INDEPENDENT_AMBULATORY_CARE_PROVIDER_SITE_OTHER): Payer: Medicare Other | Admitting: Urology

## 2015-04-25 ENCOUNTER — Encounter: Payer: Self-pay | Admitting: Urology

## 2015-04-25 VITALS — BP 157/84 | HR 93 | Ht 69.0 in | Wt 185.8 lb

## 2015-04-25 DIAGNOSIS — C61 Malignant neoplasm of prostate: Secondary | ICD-10-CM | POA: Diagnosis not present

## 2015-04-25 LAB — URINALYSIS, COMPLETE
Bilirubin, UA: NEGATIVE
GLUCOSE, UA: NEGATIVE
KETONES UA: NEGATIVE
LEUKOCYTES UA: NEGATIVE
NITRITE UA: NEGATIVE
PROTEIN UA: NEGATIVE
SPEC GRAV UA: 1.01 (ref 1.005–1.030)
Urobilinogen, Ur: 0.2 mg/dL (ref 0.2–1.0)
pH, UA: 6.5 (ref 5.0–7.5)

## 2015-04-25 LAB — MICROSCOPIC EXAMINATION
Bacteria, UA: NONE SEEN
Epithelial Cells (non renal): NONE SEEN /hpf (ref 0–10)

## 2015-04-25 NOTE — Progress Notes (Signed)
74 year old male who presents today for follow-up of his prostate cancer. The patient was diagnosed with Gleason 3+4 = 7 prostate cancer in 2/12 cores with a pretreatment PSA of 5.3, DRE was asymmetric. His TRUS volume was 36 mL. He is now status post external beam radiation therapy and completed his treatment in February 2016. He followed up with Dr. Baruch Gouty in July which point his PSA was 0.76. The patient denies any progressive voiding symptoms including dysuria, hematuria, worsening frequency or urgency. He denies any urinary incontinence. The patient denies any diarrhea or constipation or blood per rectum. The patient is sexually inactive. Current Outpatient Prescriptions on File Prior to Visit  Medication Sig Dispense Refill  . aspirin 81 MG tablet Take 81 mg by mouth daily.    . clopidogrel (PLAVIX) 75 MG tablet TAKE 1 TABLET BY MOUTH EVERY DAY 90 tablet 3  . HYDROcodone-acetaminophen (NORCO/VICODIN) 5-325 MG per tablet Take 1 tablet by mouth 2 (two) times daily as needed. 50 tablet 0  . lisinopril (PRINIVIL,ZESTRIL) 20 MG tablet Take 1 tablet (20 mg total) by mouth daily. 90 tablet 3  . metoprolol succinate (TOPROL-XL) 25 MG 24 hr tablet TAKE 1 TABLET EVERY DAY 90 tablet 1  . niacin (NIASPAN) 1000 MG CR tablet TAKE 1 TABLET EVERY DAY 90 tablet 1  . tiotropium (SPIRIVA HANDIHALER) 18 MCG inhalation capsule Place 1 capsule (18 mcg total) into inhaler and inhale daily. 90 capsule 2   No current facility-administered medications on file prior to visit.   Past Medical History  Diagnosis Date  . Prostate cancer   . Primary prostate cancer   . Cigarette smoker   . Obstructive sleep apnea of adult   . Sleep apnea   . HTN, goal below 150/90   . 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 History of myocardial infarction   . COPD, severe   . Arthritis, multiple joint involvement   . Arthritis   . Chronic pain of right knee   .  Hypertriglyceridemia   . Herpes zoster without complication   . Elevated PSA    Past Surgical History  Procedure Laterality Date  . Appendectomy    . Coronary angioplasty with stent placement  December 2002   PE: Filed Vitals:   04/25/15 1009  BP: 157/84  Pulse: 93   NAD  UA - no no evidence of infection  Impression: Intermediate risk prostate cancer, moderate comorbidities. PSA was noted to be significantly reduced 5 months after radiation therapy. Plan: The patient is following up with both Dr. Baruch Gouty and urology at this time. He has a PSA scheduled with Dr. Baruch Gouty in January 2017. Given that he has no other urologic issues I recommended that he follow-up with Dr. Baruch Gouty. He does not need follow-up at both places. If at any point the patient would like to follow-up with urology or Dr. Baruch Gouty is no longer following him, we'll be happy to resume his prostate cancer surveillance.

## 2015-04-26 LAB — PSA: Prostate Specific Ag, Serum: 1.1 ng/mL (ref 0.0–4.0)

## 2015-05-08 ENCOUNTER — Telehealth: Payer: Self-pay | Admitting: Urology

## 2015-05-08 NOTE — Telephone Encounter (Signed)
Pt called, states he had a blood draw on 9/13, hasn't heard back with results.  Please call.

## 2015-05-09 NOTE — Telephone Encounter (Signed)
I see pt PSA results under the labs tab. Please advise.

## 2015-06-05 ENCOUNTER — Ambulatory Visit (INDEPENDENT_AMBULATORY_CARE_PROVIDER_SITE_OTHER): Payer: Medicare Other | Admitting: Family Medicine

## 2015-06-05 ENCOUNTER — Encounter: Payer: Self-pay | Admitting: Family Medicine

## 2015-06-05 ENCOUNTER — Ambulatory Visit: Payer: Self-pay | Admitting: Family Medicine

## 2015-06-05 VITALS — BP 130/72 | HR 90 | Temp 98.5°F | Resp 16 | Wt 187.8 lb

## 2015-06-05 DIAGNOSIS — I2510 Atherosclerotic heart disease of native coronary artery without angina pectoris: Secondary | ICD-10-CM | POA: Diagnosis not present

## 2015-06-05 DIAGNOSIS — J449 Chronic obstructive pulmonary disease, unspecified: Secondary | ICD-10-CM | POA: Diagnosis not present

## 2015-06-05 DIAGNOSIS — I10 Essential (primary) hypertension: Secondary | ICD-10-CM

## 2015-06-05 DIAGNOSIS — E785 Hyperlipidemia, unspecified: Secondary | ICD-10-CM | POA: Diagnosis not present

## 2015-06-05 NOTE — Progress Notes (Signed)
Name: Bruce Gordon   MRN: [id number]    DOB: 1940/12/12   Date:06/05/2015       Progress Note  Subjective  Chief Complaint  Chief Complaint  Patient presents with  . Follow-up    patient is here for a 38-month follow-up    HPI  Bruce Gordon is a 74 year old male with a known history of CAD with history of MI and stent placement now on medication management for risk factors such as HTN and HLD. He voices no concerns today. Doing well and is using all of his medication as instructed. First diagnosed with hypertension several years ago. Current anti-hypertension medication regimen includes dietary modification, weight management and Toprol XL 25mg  a day, Lisinopril 20mg  a day. Recently amlodipine was discontinued due to low BPs and Lisinopril dose was lowered. Patient is following physician recommended management. Checking blood pressure outside of physician office. Results average systolic 89-381 and average diastolic 0-17. Associated symptoms do not include headache, dizziness, nausea, lower extremity swelling, shortness of breath, chest pain, numbness. Complicated medical picture, related diagnosis include CAD with 1 stent in place (unknown which vessel), history of MI, continues to smoke, COPD, sleep apnea.    Past Medical History  Diagnosis Date  . Prostate cancer ([place])   . Primary prostate cancer ([place])   . Cigarette smoker   . Obstructive sleep apnea of adult   . Sleep apnea   . HTN, goal below 150/90   . 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 History of myocardial infarction   . COPD, severe ([place])   . Arthritis, multiple joint involvement   . Arthritis   . Chronic pain of right knee   . Hypertriglyceridemia   . Herpes zoster without complication   . Elevated PSA     Patient Active Problem List   Diagnosis Date Noted  . Presence of stent in coronary artery 03/20/2015  . History of myocardial infarction  03/20/2015  . Sleep apnea in adult 03/20/2015  . Hyperlipidemia LDL goal <70 11/01/2008  . Chronic pain of multiple joints 11/01/2008  . Tobacco use disorder 11/01/2008  . Hypertension goal BP (blood pressure) < 140/90 11/01/2008  . Coronary artery disease involving native coronary 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 angina pectoris 11/01/2008  . COPD, moderate ([place]) 11/01/2008  . History of prostate cancer 11/01/2008    Social History  Substance Use Topics  . Smoking status: Current Every Day Smoker  . Smokeless tobacco: Never Used  . Alcohol Use: No     Current outpatient prescriptions:  .  aspirin 81 MG tablet, Take 81 mg by mouth daily., Disp: , Rfl:  .  clopidogrel (PLAVIX) 75 MG tablet, TAKE 1 TABLET BY MOUTH EVERY DAY, Disp: 90 tablet, Rfl: 3 .  HYDROcodone-acetaminophen (NORCO/VICODIN) 5-325 MG per tablet, Take 1 tablet by mouth 2 (two) times daily as needed., Disp: 50 tablet, Rfl: 0 .  lisinopril (PRINIVIL,ZESTRIL) 20 MG tablet, Take 1 tablet (20 mg total) by mouth daily., Disp: 90 tablet, Rfl: 3 .  metoprolol succinate (TOPROL-XL) 25 MG 24 hr tablet, TAKE 1 TABLET EVERY DAY, Disp: 90 tablet, Rfl: 1 .  niacin (NIASPAN) 1000 MG CR tablet, TAKE 1 TABLET EVERY DAY, Disp: 90 tablet, Rfl: 1 .  tiotropium (SPIRIVA HANDIHALER) 18 MCG inhalation capsule, Place 1 capsule (18 mcg total) into inhaler and inhale daily., Disp: 90 capsule, Rfl: 2  No Known Allergies  Review of Systems  CONSTITUTIONAL: No  significant weight changes, fever, chills, weakness or fatigue.  CARDIOVASCULAR: No chest pain, chest pressure or chest discomfort. No palpitations or edema.  RESPIRATORY: No shortness of breath, cough or sputum.  GASTROINTESTINAL: No anorexia, nausea, vomiting. No changes in bowel habits. No abdominal pain or blood.  NEUROLOGICAL: No headache, dizziness, syncope, paralysis, ataxia, numbness or tingling in the extremities. No memory changes. No change in bowel or bladder control.   PSYCHIATRIC: No change in mood. No change in sleep pattern.     Objective  BP 130/72 mmHg  Pulse 90  Temp(Src) 98.5 F (36.9 C) (Oral)  Resp 16  Wt 187 lb 12.8 oz (85.186 kg)  SpO2 95%  Body mass index is 27.72 kg/(m^2).   Physical Exam  Constitutional: Patient appears well-developed and well-nourished. In no distress.   Neck: Normal range of motion. Neck supple. No JVD present. No thyromegaly present.  Cardiovascular: Normal rate, regular rhythm and normal heart sounds.  No murmur heard.  Pulmonary/Chest: Effort normal and breath sounds normal. No respiratory distress. Musculoskeletal: Normal range of motion bilateral UE and LE, no joint effusions. Peripheral vascular: Bilateral LE no edema. Neurological: CN II-XII grossly intact with no focal deficits. Alert and oriented to person, place, and time. Coordination, balance, strength, speech and gait are normal.  Skin: Skin is warm and dry. No rash noted. No erythema.  Psychiatric: Patient has a normal mood and affect. Behavior is normal in office today. Judgment and thought content normal in office today.    Assessment & Plan  1. Hypertension goal BP (blood pressure) < 140/90 Well controled.  2. Coronary artery disease involving native coronary 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 angina pectoris Medically optimized. Although smoking cessation would be best.  3. Hyperlipidemia LDL goal <70 Continue Niacin per patient preference not to switch to a statin.   4. COPD, moderate (HCC) Stable on Spiriva. Smoking cessation encouraged.

## 2015-07-10 ENCOUNTER — Other Ambulatory Visit: Payer: Self-pay | Admitting: Family Medicine

## 2015-08-06 ENCOUNTER — Other Ambulatory Visit: Payer: Self-pay | Admitting: Family Medicine

## 2015-08-15 ENCOUNTER — Encounter: Payer: Self-pay | Admitting: Urology

## 2015-08-15 ENCOUNTER — Ambulatory Visit (INDEPENDENT_AMBULATORY_CARE_PROVIDER_SITE_OTHER): Payer: Medicare Other | Admitting: Urology

## 2015-08-15 VITALS — BP 171/91 | HR 93 | Ht 68.0 in | Wt 186.8 lb

## 2015-08-15 DIAGNOSIS — R3129 Other microscopic hematuria: Secondary | ICD-10-CM | POA: Diagnosis not present

## 2015-08-15 DIAGNOSIS — Z720 Tobacco use: Secondary | ICD-10-CM

## 2015-08-15 DIAGNOSIS — C61 Malignant neoplasm of prostate: Secondary | ICD-10-CM | POA: Diagnosis not present

## 2015-08-15 DIAGNOSIS — R310 Gross hematuria: Secondary | ICD-10-CM

## 2015-08-15 DIAGNOSIS — F17200 Nicotine dependence, unspecified, uncomplicated: Secondary | ICD-10-CM

## 2015-08-15 LAB — URINALYSIS, COMPLETE
BILIRUBIN UA: NEGATIVE
Glucose, UA: NEGATIVE
Ketones, UA: NEGATIVE
Leukocytes, UA: NEGATIVE
NITRITE UA: NEGATIVE
PH UA: 5.5 (ref 5.0–7.5)
Specific Gravity, UA: 1.01 (ref 1.005–1.030)
UUROB: 0.2 mg/dL (ref 0.2–1.0)

## 2015-08-15 LAB — MICROSCOPIC EXAMINATION
BACTERIA UA: NONE SEEN
Epithelial Cells (non renal): NONE SEEN /hpf (ref 0–10)
WBC UA: NONE SEEN /HPF (ref 0–?)

## 2015-08-15 NOTE — Progress Notes (Signed)
08/15/2015 10:54 PM   Bruce Gordon June 01, 1941 [id number]  Referring provider: Bobetta Lime, MD [address] [place], [place] [address]  Chief Complaint  Patient presents with  . Hematuria    HPI: 75 year old male with prostate cancer s/p EBRT presenting today with painless gross hematuria. He reports recurrent episodes of painless gross hematuria since Sunday. He reports that the bloody urine comes and goes without clots. He has no dysuria or any other urinary symptoms today.    He is on plavix/ ASA.   He smokes 1 ppd x 50+ years.   Prostate cancer history:  Dx Gleason 3+4 = 7 prostate cancer in 2/12 cores with a pretreatment PSA of 5.3, DRE was asymmetric, TRUS volume was 36 mL. S/p EBRT completed in February 2016. He followed up with Dr. Baruch Gouty in July which point his PSA was 0.76, repeat 1.1 04/2015.    PMH: Past Medical History  Diagnosis Date  . Prostate cancer ([place])   . Primary prostate cancer ([place])   . Cigarette smoker   . Obstructive sleep apnea of adult   . Sleep apnea   . HTN, goal below 150/90   . 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 History of myocardial infarction   . COPD, severe ([place])   . Arthritis, multiple joint involvement   . Arthritis   . Chronic pain of right knee   . Hypertriglyceridemia   . Herpes zoster without complication   . Elevated PSA     Surgical History: Past Surgical History  Procedure Laterality Date  . Appendectomy    . Coronary angioplasty with stent placement  December 2002    Home Medications:    Medication List       This list is accurate as of: 08/15/15 11:59 PM.  Always use your most recent med list.               aspirin 81 MG tablet  Take 81 mg by mouth daily.     clopidogrel 75 MG tablet  Commonly known as:  PLAVIX  TAKE 1 TABLET BY MOUTH EVERY DAY     HYDROcodone-acetaminophen 5-325 MG tablet  Commonly known as:   NORCO/VICODIN  Take 1 tablet by mouth 2 (two) times daily as needed.     lisinopril 20 MG tablet  Commonly known as:  PRINIVIL,ZESTRIL  Take 1 tablet (20 mg total) by mouth daily.     metoprolol succinate 25 MG 24 hr tablet  Commonly known as:  TOPROL-XL  TAKE 1 TABLET EVERY DAY     niacin 1000 MG CR tablet  Commonly known as:  NIASPAN  TAKE 1 TABLET EVERY DAY     tiotropium 18 MCG inhalation capsule  Commonly known as:  SPIRIVA HANDIHALER  Place 1 capsule (18 mcg total) into inhaler and inhale daily.        Allergies: No Known Allergies  Family History: Family History  Problem Relation Age of Onset  . Hypertension Father   . Aneurysm Brother     Social History:  reports that he has been smoking.  He has never used smokeless tobacco. He reports that he does not drink alcohol or use illicit drugs.  ROS: UROLOGY Frequent Urination?: No Hard to postpone urination?: No Burning/pain with urination?: No Get up at night to urinate?: No Leakage of urine?: No Urine stream starts and stops?: No Trouble starting stream?: No Do you have to strain to urinate?:  No Blood in urine?: Yes Urinary tract infection?: No Sexually transmitted disease?: No Injury to kidneys or bladder?: No Painful intercourse?: No Weak stream?: No Erection problems?: Yes Penile pain?: No  Gastrointestinal Nausea?: No Vomiting?: No Indigestion/heartburn?: No Diarrhea?: No Constipation?: No  Constitutional Fever: No Night sweats?: No Weight loss?: No Fatigue?: No  Skin Skin rash/lesions?: No Itching?: No  Eyes Blurred vision?: No Double vision?: No  Ears/Nose/Throat Sore throat?: No Sinus problems?: No  Hematologic/Lymphatic Swollen glands?: No Easy bruising?: Yes  Cardiovascular Leg swelling?: No Chest pain?: No  Respiratory Cough?: No Shortness of breath?: No  Endocrine Excessive thirst?: No  Musculoskeletal Back pain?: No Joint pain?:  No  Neurological Headaches?: No Dizziness?: No  Psychologic Depression?: No Anxiety?: No  Physical Exam: BP 171/91 mmHg  Pulse 93  Ht 5\' 8"  (1.727 m)  Wt 186 lb 12.8 oz (84.732 kg)  BMI 28.41 kg/m2  Constitutional:  Alert and oriented, No acute distress.  Smells strongly on cigarettes.  Wife present today. HEENT: [place] AT, moist mucus membranes.  Trachea midline, no masses. Respiratory: Normal respiratory effort, no increased work of breathing. GI: Abdomen is soft, nontender, nondistended, no abdominal masses GU: No CVA tenderness.  Skin: No rashes, bruises or suspicious lesions. Neurologic: Grossly intact, no focal deficits, moving all 4 extremities. Psychiatric: Normal mood and affect.  Laboratory Data: Lab Results  Component Value Date   WBC 5.3 09/07/2014   HGB 15.0 09/07/2014   HCT 44.3 09/07/2014   MCV 100 09/07/2014   PLT 164 09/07/2014     Lab Results  Component Value Date   PSA 1.1 04/25/2015   PSA 0.76 03/06/2015   Urinalysis Results for orders placed or performed in visit on 08/15/15  Microscopic Examination  Result Value Ref Range   WBC, UA None seen 0 -  5 /hpf   RBC, UA 3-10 (A) 0 -  2 /hpf   Epithelial Cells (non renal) None seen 0 - 10 /hpf   Mucus, UA Present (A) Not Estab.   Bacteria, UA None seen None seen/Few  Urinalysis, Complete  Result Value Ref Range   Specific Gravity, UA 1.010 1.005 - 1.030   pH, UA 5.5 5.0 - 7.5   Color, UA Yellow Yellow   Appearance Ur Clear Clear   Leukocytes, UA Negative Negative   Protein, UA 2+ (A) Negative/Trace   Glucose, UA Negative Negative   Ketones, UA Negative Negative   RBC, UA 3+ (A) Negative   Bilirubin, UA Negative Negative   Urobilinogen, Ur 0.2 0.2 - 1.0 mg/dL   Nitrite, UA Negative Negative   Microscopic Examination See below:   BUN+Creat  Result Value Ref Range   BUN 16 8 - 27 mg/dL   Creatinine, Ser 0.96 0.76 - 1.27 mg/dL   GFR calc non Af Amer 78 >59 mL/min/1.73   GFR calc Af Amer 90  >59 mL/min/1.73   BUN/Creatinine Ratio 17 10 - 22    Pertinent Imaging: n/a  Assessment & Plan:    1. Hematuria, gross Recommend gross hematuria work up with CT Urogram and cystoscopy.  DDx discussed.  No evidence of infection.  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  - Urinalysis, Complete - BUN+Creat - CT Abdomen Pelvis W Wo Contrast; Future  2. Prostate cancer ([place]) F/u with Dr. Baruch Gouty.  PSA trend as above.  3. Smoker Revisited smoking cessation today, unwilling to quit at this time.     Return in about 3 weeks (  around 09/05/2015) for cystoscopy, f/u CT Urogram results.  Hollice Espy, MD  [facility] [address], [place], [place] [address] [phone]

## 2015-08-15 NOTE — Patient Instructions (Signed)
Cystoscopy  Cystoscopy is a procedure that is used to help your caregiver diagnose and sometimes treat conditions that affect your lower urinary tract. Your lower urinary tract includes your bladder and the tube through which urine passes from your bladder out of your body (urethra). Cystoscopy is performed with a thin, tube-shaped instrument (cystoscope). The cystoscope has lenses and a light at the end so that your caregiver can see inside your bladder. The cystoscope is inserted at the entrance of your urethra. Your caregiver guides it through your urethra and into your bladder. There are two main types of cystoscopy:  · Flexible cystoscopy (with a flexible cystoscope).  · Rigid cystoscopy (with a rigid cystoscope).  Cystoscopy may be recommended for many conditions, including:  · Urinary tract infections.  · Blood in your urine (hematuria).  · Loss of bladder control (urinary incontinence) or overactive bladder.  · Unusual cells found in a urine sample.  · Urinary blockage.  · Painful urination.  Cystoscopy may also be done to remove a sample of your tissue to be checked under a microscope (biopsy). It may also be done to remove or destroy bladder stones.  LET YOUR CAREGIVER KNOW ABOUT:  · Allergies to food or medicine.  · Medicines taken, including vitamins, herbs, eyedrops, over-the-counter medicines, and creams.  · Use of steroids (by mouth or creams).  · Previous problems with anesthetics or numbing medicines.  · History of bleeding problems or blood clots.  · Previous surgery.  · Other health problems, including diabetes and kidney problems.  · Possibility of pregnancy, if this applies.  PROCEDURE  The area around the opening to your urethra will be cleaned. A medicine to numb your urethra (local anesthetic) is used. If a tissue sample or stone is removed during the procedure, you may be given a medicine to make you sleep (general anesthetic).  Your caregiver will gently insert the tip of the cystoscope  into your urethra. The cystoscope will be slowly glided through your urethra and into your bladder. Sterile fluid will flow through the cystoscope and into your bladder. The fluid will expand and stretch your bladder. This gives your caregiver a better view of your bladder walls. The procedure lasts about 15-20 minutes.  AFTER THE PROCEDURE  If a local anesthetic is used, you will be allowed to go home as soon as you are ready. If a general anesthetic is used, you will be taken to a recovery area until you are stable. You may have temporary bleeding and burning on urination.     This information is not intended to replace advice given to you by your health care provider. Make sure you discuss any questions you have with your health care provider.     Document Released: 07/26/2000 Document Revised: 08/19/2014 Document Reviewed: 01/20/2012  Elsevier Interactive Patient Education ©2016 [employer] Inc[employer]

## 2015-08-16 LAB — BUN+CREAT
BUN/Creatinine Ratio: 17 (ref 10–22)
BUN: 16 mg/dL (ref 8–27)
CREATININE: 0.96 mg/dL (ref 0.76–1.27)
GFR, EST AFRICAN AMERICAN: 90 mL/min/{1.73_m2} (ref >59)
GFR, EST NON AFRICAN AMERICAN: 78 mL/min/{1.73_m2} (ref >59)

## 2015-08-21 ENCOUNTER — Ambulatory Visit: Payer: Medicare Other | Admitting: Family Medicine

## 2015-09-01 ENCOUNTER — Ambulatory Visit
Admission: RE | Admit: 2015-09-01 | Discharge: 2015-09-01 | Disposition: A | Discharge status: Home / Self Care | Payer: Medicare Other | Source: Ambulatory Visit | Attending: Urology | Admitting: Urology

## 2015-09-01 DIAGNOSIS — R310 Gross hematuria: Secondary | ICD-10-CM

## 2015-09-01 DIAGNOSIS — I2510 Atherosclerotic heart disease of native coronary artery without angina pectoris: Secondary | ICD-10-CM | POA: Diagnosis not present

## 2015-09-01 DIAGNOSIS — N281 Cyst of kidney, acquired: Secondary | ICD-10-CM | POA: Insufficient documentation

## 2015-09-01 DIAGNOSIS — M5136 Other intervertebral disc degeneration, lumbar region: Secondary | ICD-10-CM | POA: Diagnosis not present

## 2015-09-01 DIAGNOSIS — J432 Centrilobular emphysema: Secondary | ICD-10-CM | POA: Diagnosis not present

## 2015-09-01 DIAGNOSIS — N200 Calculus of kidney: Secondary | ICD-10-CM | POA: Diagnosis not present

## 2015-09-01 HISTORY — DX: Unspecified asthma, uncomplicated: J45.909

## 2015-09-01 MED ORDER — IOHEXOL 300 MG/ML  SOLN
150.0000 mL | Freq: Once | INTRAMUSCULAR | Status: AC | PRN
Start: 2015-09-01 — End: 2015-09-01
  Administered 2015-09-01: 125 mL via INTRAVENOUS

## 2015-09-04 ENCOUNTER — Other Ambulatory Visit: Payer: Self-pay | Admitting: Family Medicine

## 2015-09-04 DIAGNOSIS — G8929 Other chronic pain: Secondary | ICD-10-CM

## 2015-09-04 DIAGNOSIS — M2550 Pain in unspecified joint: Principal | ICD-10-CM

## 2015-09-04 MED ORDER — HYDROCODONE-ACETAMINOPHEN 5-325 MG PO TABS: 1.0000 | ORAL_TABLET | Freq: Two times a day (BID) | ORAL | Status: DC | PRN

## 2015-09-08 ENCOUNTER — Ambulatory Visit (INDEPENDENT_AMBULATORY_CARE_PROVIDER_SITE_OTHER): Payer: Medicare Other | Admitting: Urology

## 2015-09-08 VITALS — BP 173/74 | HR 94 | Ht 68.0 in | Wt 185.5 lb

## 2015-09-08 DIAGNOSIS — R310 Gross hematuria: Secondary | ICD-10-CM | POA: Diagnosis not present

## 2015-09-08 DIAGNOSIS — C61 Malignant neoplasm of prostate: Secondary | ICD-10-CM

## 2015-09-08 DIAGNOSIS — N200 Calculus of kidney: Secondary | ICD-10-CM | POA: Diagnosis not present

## 2015-09-08 DIAGNOSIS — Q541 Hypospadias, penile: Secondary | ICD-10-CM

## 2015-09-08 LAB — URINALYSIS, COMPLETE
Bilirubin, UA: NEGATIVE
Glucose, UA: NEGATIVE
KETONES UA: NEGATIVE
Leukocytes, UA: NEGATIVE
NITRITE UA: NEGATIVE
SPEC GRAV UA: 1.01 (ref 1.005–1.030)
Urobilinogen, Ur: 0.2 mg/dL (ref 0.2–1.0)
pH, UA: 6.5 (ref 5.0–7.5)

## 2015-09-08 LAB — MICROSCOPIC EXAMINATION: Bacteria, UA: NONE SEEN

## 2015-09-08 MED ORDER — CIPROFLOXACIN HCL 500 MG PO TABS
500.0000 mg | ORAL_TABLET | Freq: Once | ORAL | Status: AC
  Administered 2015-09-08: 500 mg via ORAL

## 2015-09-08 MED ORDER — LIDOCAINE HCL 2 % EX GEL
1.0000 "application " | Freq: Once | CUTANEOUS | Status: AC
  Administered 2015-09-08: 1 via URETHRAL

## 2015-09-08 NOTE — Progress Notes (Signed)
10:30 AM  09/09/2015   Bruce Gordon 06/22/1941 [id number]  Referring provider: Bobetta Lime, MD [address] [place], [place] [address]  Chief Complaint  Patient presents with  . Cysto    HPI: 75 year old male with prostate cancer s/p EBRT who return today for cystoscopy for completion of work up of painless gross hematuria.  CT Urogram from 09/01/15 showed bilateral nonobstructing renal calculi up to 7 mm (asymptomatic, no previous history of flank pain/ passing stones) and renal cysts but no suspicious masses.      He has no dysuria or any other urinary symptoms today.  No further bleeding.    He is on plavix/ ASA.   He smokes 1 ppd x 50+ years.   Prostate cancer history:  Dx Gleason 3+4 = 7 prostate cancer in 2/12 cores with a pretreatment PSA of 5.3, DRE was asymmetric, TRUS volume was 36 mL. S/p EBRT completed in February 2016. He followeded up with Dr. Baruch Gouty in July which point his PSA was 0.76, repeat 1.1 04/2015.    PMH: Past Medical History  Diagnosis Date  . Prostate cancer ([place])   . Primary prostate cancer ([place])   . Cigarette smoker   . Obstructive sleep apnea of adult   . Sleep apnea   . HTN, goal below 150/90   . 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 History of myocardial infarction   . COPD, severe ([place])   . Arthritis, multiple joint involvement   . Arthritis   . Chronic pain of right knee   . Hypertriglyceridemia   . Herpes zoster without complication   . Elevated PSA   . Asthma     Surgical History: Past Surgical History  Procedure Laterality Date  . Appendectomy    . Coronary angioplasty with stent placement  December 2002    Home Medications:    Medication List       This list is accurate as of: 09/08/15 11:59 PM.  Always use your most recent med list.               aspirin 81 MG tablet  Take 81 mg by mouth daily.     clopidogrel 75 MG tablet   Commonly known as:  PLAVIX  TAKE 1 TABLET BY MOUTH EVERY DAY     HYDROcodone-acetaminophen 5-325 MG tablet  Commonly known as:  NORCO/VICODIN  Take 1 tablet by mouth 2 (two) times daily as needed.     lisinopril 40 MG tablet  Commonly known as:  PRINIVIL,ZESTRIL     metoprolol succinate 25 MG 24 hr tablet  Commonly known as:  TOPROL-XL  TAKE 1 TABLET EVERY DAY     niacin 1000 MG CR tablet  Commonly known as:  NIASPAN  TAKE 1 TABLET EVERY DAY     tiotropium 18 MCG inhalation capsule  Commonly known as:  SPIRIVA HANDIHALER  Place 1 capsule (18 mcg total) into inhaler and inhale daily.        Allergies: No Known Allergies  Family History: Family History  Problem Relation Age of Onset  . Hypertension Father   . Aneurysm Brother     Social History:  reports that he has been smoking.  He has never used smokeless tobacco. He reports that he does not drink alcohol or use illicit drugs.  Physical Exam: BP 173/74 mmHg  Pulse 94  Ht 5\' 8"  (1.727 m)  Wt 185 lb 8 oz (  84.142 kg)  BMI 28.21 kg/m2  Constitutional:  Alert and oriented, No acute distress.    HEENT: [place] AT, moist mucus membranes.  Trachea midline, no masses. Respiratory: Normal respiratory effort, no increased work of breathing. GI: Abdomen is soft, nontender, nondistended, no abdominal masses GU: No CVA tenderness. Circumcised phallus with subcoronal hypospadias noted.  Skin: No rashes, bruises or suspicious lesions. Neurologic: Grossly intact, no focal deficits, moving all 4 extremities. Psychiatric: Normal mood and affect.  Laboratory Data: Lab Results  Component Value Date   WBC 5.3 09/07/2014   HGB 15.0 09/07/2014   HCT 44.3 09/07/2014   MCV 100 09/07/2014   PLT 164 09/07/2014     Lab Results  Component Value Date   PSA 0.76 03/06/2015   Urinalysis Results for orders placed or performed in visit on 09/08/15  Microscopic Examination  Result Value Ref Range   WBC, UA 0-5 0 -  5 /hpf   RBC, UA  0-2 0 -  2 /hpf   Epithelial Cells (non renal) 0-10 0 - 10 /hpf   Bacteria, UA None seen None seen/Few  Urinalysis, Complete  Result Value Ref Range   Specific Gravity, UA 1.010 1.005 - 1.030   pH, UA 6.5 5.0 - 7.5   Color, UA Yellow Yellow   Appearance Ur Clear Clear   Leukocytes, UA Negative Negative   Protein, UA 2+ (A) Negative/Trace   Glucose, UA Negative Negative   Ketones, UA Negative Negative   RBC, UA 1+ (A) Negative   Bilirubin, UA Negative Negative   Urobilinogen, Ur 0.2 0.2 - 1.0 mg/dL   Nitrite, UA Negative Negative   Microscopic Examination See below:      Pertinent Imaging:     CLINICAL DATA: Gross hematuria starting 12 days ago. History of prostate cancer 1 year ago with radiation therapy.  EXAM: CT ABDOMEN AND PELVIS WITHOUT AND WITH CONTRAST  TECHNIQUE: Multidetector CT imaging of the abdomen and pelvis was performed following the standard protocol before and following the bolus administration of intravenous contrast.  CONTRAST: 139mL OMNIPAQUE IOHEXOL 300 MG/ML SOLN  COMPARISON: None.  FINDINGS: Lower chest: Centrilobular emphysema. Coronary artery atherosclerotic calcification.  Hepatobiliary: Unremarkable  Pancreas: Unremarkable  Spleen: Unremarkable  Adrenals/Urinary Tract: Adrenal glands unremarkable. Multiple bilateral simple appearing renal cysts. Minimally complex 2.1 cm left mid upper kidney cyst posteromedially, image 22 series 6. Some of the renal lesions, including the 8 mm cystic lesion in the left kidney lower pole on image 57 series 9, are technically too small to characterize.  7 mm left mid kidney nonobstructive calculus. 2 mm left mid kidney nonobstructive calculus. 2 mm right mid kidney nonobstructive calculus. Vascular calcification in the right kidney lower pole on image 31 series 2. No filling defect concerning for urothelial mass is identified along the urothelium.  Stomach/Bowel: Sigmoid  diverticulosis without active diverticulitis.  Vascular/Lymphatic: Aortoiliac atherosclerotic vascular disease. No pathologic adenopathy identified.  Reproductive: Mildly prominent prostate gland 5.0 by 4.1 cm. Central calcifications along the prostate gland. Fiducials along the margins of the prostate gland laterally. Symmetric seminal vesicles.  Other: No supplemental non-categorized findings.  Musculoskeletal: Levoconvex lumbar rotary scoliosis. No compelling findings of osseous metastatic disease. The spondylosis and degenerative disc disease cause multilevel impingement.  IMPRESSION: 1. Bilateral nonobstructive nephrolithiasis. 2. Multiple bilateral renal cysts. Some of the small renal lesions are technically too small to characterize. 3. Centrilobular emphysema. 4. Coronary atherosclerosis. Aortoiliac atherosclerotic vascular disease. 5. Slightly prominent prostate gland at 5.0 by 4.1 cm, with fiducials  along the lateral margins. Central calcification in the prostate gland. 6. 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   Electronically Signed  By: Van Clines M.D.  On: 09/01/2015 11:41    Cystoscopy Procedure Note  Patient identification was confirmed, informed consent was obtained, and patient was prepped using Betadine solution.  Lidocaine jelly was administered per urethral meatus.    Preoperative abx where received prior to procedure.     Pre-Procedure: - Inspection reveals a normal caliber ureteral meatus with subcoronal hypospadius.  Procedure: The flexible cystoscope was introduced without difficulty - No urethral strictures/lesions are present. - normal prostate somewhat hypovascular mucosa, minimal coaptation with relatively short prostatic length - Normal bladder neck - Bilateral ureteral orifices identified - Bladder mucosa  reveals no ulcers, tumors, or lesions -  No bladder stones - Moderate trabeculation with very small posterior wall diverticulum   Retroflexion unremarkable.  Post-Procedure: - Patient tolerated the procedure well   Assessment & Plan:    1. Hematuria, gross No masses on cystoscopy with radiation changes of prostate.  Bilateral stones on CT Urogram.  No other identifiable etiology of painless hematuria, suspect event related to radiation. - Urinalysis, Complete - ciprofloxacin (CIPRO) tablet 500 mg; Take 1 tablet (500 mg total) by mouth once. - lidocaine (XYLOCAINE) 2 % jelly 1 application; Place 1 application into the urethra once.  2. Prostate cancer [facility]) S/p XRT, followed by radiation oncology  3. Renal calculi Incidental bilateral renal calculi, largest 7 mm. Asymptomatic and no history of previous stone episodes. Will continue to follow.  4. Hypospadias, penile Stands to void. Asymptomatic. Widely patent on cystoscopy today. No intervention necessary.  Return in about 6 months (around 03/07/2016) for recheck prostate cancer, hematuria, kidney stones.  Hollice Espy, MD  [facility] [address], [place], [place] [address] [phone]

## 2015-09-15 ENCOUNTER — Inpatient Hospital Stay: Payer: Medicare Other | Attending: Radiation Oncology

## 2015-09-15 ENCOUNTER — Other Ambulatory Visit: Payer: Self-pay | Admitting: *Deleted

## 2015-09-15 ENCOUNTER — Encounter: Payer: Self-pay | Admitting: Radiation Oncology

## 2015-09-15 ENCOUNTER — Ambulatory Visit
Admission: RE | Admit: 2015-09-15 | Discharge: 2015-09-15 | Disposition: A | Discharge status: Home / Self Care | Payer: Medicare Other | Source: Ambulatory Visit | Attending: Radiation Oncology | Admitting: Radiation Oncology

## 2015-09-15 VITALS — BP 158/90 | HR 90 | Temp 98.0°F | Wt 186.5 lb

## 2015-09-15 DIAGNOSIS — C61 Malignant neoplasm of prostate: Secondary | ICD-10-CM | POA: Insufficient documentation

## 2015-09-15 LAB — PSA: PSA: 1.85 ng/mL (ref 0.00–4.00)

## 2015-09-15 NOTE — Progress Notes (Signed)
Radiation Oncology Follow up Note  Name: Bruce Gordon   Date:   09/15/2015 MRN:  [id number] DOB: 19-Feb-1941    This 75 y.o. male presents to the clinic today for follow-up for prostate cancer Gleason 7 status post I MRT radiation therapy now out 11 months.  REFERRING PROVIDER: Bobetta Lime, MD  HPI: Patient is a 75 year old male presented with a Gleason 7 (3+4) adenocarcinoma prostate presenting the PSA 5.3 clinical stage was a T2 N0 M0. He underwent I MRT radiation therapy and is seen today in routine follow-up 11 months out and is doing well. He specifically denies diarrhea dysuria or any other GI/GU complaints. Last PSA was 1.1 back in September 2016.Marland Kitchen Patient's had hematuria that has cleared is being followed by urology. He specifically denies diarrhea dysuria or any other GI/GU complaints.  COMPLICATIONS OF TREATMENT: none Nephrolithiasis FOLLOW UP COMPLIANCE: keeps appointments   PHYSICAL EXAM:  BP 158/90 mmHg  Pulse 90  Temp(Src) 98 F (36.7 C)  Wt 186 lb 8.2 oz (84.6 kg) On rectal exam rectal sphincter tone is good. Prostate is smooth contracted without evidence of nodularity or mass. Sulcus is preserved bilaterally. No discrete nodularity is identified. No other rectal abnormalities are noted. Well-developed well-nourished patient in NAD. HEENT reveals PERLA, EOMI, discs not visualized.  Oral cavity is clear. No oral mucosal lesions are identified. Neck is clear without evidence of cervical or supraclavicular adenopathy. Lungs are clear to A&P. Cardiac examination is essentially unremarkable with regular rate and rhythm without murmur rub or thrill. Abdomen is benign with no organomegaly or masses noted. Motor sensory and DTR levels are equal and symmetric in the upper and lower extremities. Cranial nerves II through XII are grossly intact. Proprioception is intact. No peripheral adenopathy or edema is identified. No motor or sensory levels are noted. Crude visual fields are  within normal range.  RADIOLOGY RESULTS: Recent CT scan from January 2017 shows bilateral nonobstructing nephrolithiasis.  PLAN: At the present time patient is doing well his PSA is stable around 1.1. I am please was overall progress. I've asked to see him back in 1 year for follow-up he continues close follow-up care with urology. Patient is to call with any concerns.  I would like to take this opportunity for allowing me to participate in the care of your patient.Armstead Peaks., MD

## 2015-09-20 ENCOUNTER — Ambulatory Visit: Payer: Medicare Other | Admitting: Family Medicine

## 2015-10-03 ENCOUNTER — Other Ambulatory Visit: Payer: Self-pay | Admitting: Family Medicine

## 2015-10-03 DIAGNOSIS — Z716 Tobacco abuse counseling: Secondary | ICD-10-CM

## 2015-10-03 MED ORDER — NICOTINE 21 MG/24HR TD PT24: 21.0000 mg | MEDICATED_PATCH | Freq: Every day | TRANSDERMAL | Status: DC

## 2015-10-03 MED ORDER — NICOTINE 14 MG/24HR TD PT24: 14.0000 mg | MEDICATED_PATCH | Freq: Every day | TRANSDERMAL | Status: DC

## 2015-10-03 NOTE — Progress Notes (Signed)
Patient here today for wife's office visit and requested smoking cessation help with nicotine patch.

## 2015-10-27 ENCOUNTER — Other Ambulatory Visit: Payer: Self-pay | Admitting: Family Medicine

## 2015-12-05 ENCOUNTER — Encounter: Payer: Medicare Other | Admitting: Family Medicine

## 2015-12-06 ENCOUNTER — Ambulatory Visit (INDEPENDENT_AMBULATORY_CARE_PROVIDER_SITE_OTHER): Payer: Medicare Other | Admitting: Family Medicine

## 2015-12-06 ENCOUNTER — Encounter: Payer: Self-pay | Admitting: Family Medicine

## 2015-12-06 VITALS — BP 104/60 | HR 110 | Temp 98.4°F | Resp 14 | Wt 175.0 lb

## 2015-12-06 DIAGNOSIS — J449 Chronic obstructive pulmonary disease, unspecified: Secondary | ICD-10-CM

## 2015-12-06 DIAGNOSIS — Z8546 Personal history of malignant neoplasm of prostate: Secondary | ICD-10-CM

## 2015-12-06 DIAGNOSIS — G8929 Other chronic pain: Secondary | ICD-10-CM

## 2015-12-06 DIAGNOSIS — R634 Abnormal weight loss: Secondary | ICD-10-CM | POA: Insufficient documentation

## 2015-12-06 DIAGNOSIS — I10 Essential (primary) hypertension: Secondary | ICD-10-CM

## 2015-12-06 DIAGNOSIS — M2550 Pain in unspecified joint: Secondary | ICD-10-CM | POA: Diagnosis not present

## 2015-12-06 DIAGNOSIS — R000 Tachycardia, unspecified: Secondary | ICD-10-CM | POA: Diagnosis not present

## 2015-12-06 DIAGNOSIS — I252 Old myocardial infarction: Secondary | ICD-10-CM

## 2015-12-06 DIAGNOSIS — E785 Hyperlipidemia, unspecified: Secondary | ICD-10-CM | POA: Diagnosis not present

## 2015-12-06 DIAGNOSIS — J432 Centrilobular emphysema: Secondary | ICD-10-CM

## 2015-12-06 DIAGNOSIS — F17200 Nicotine dependence, unspecified, uncomplicated: Secondary | ICD-10-CM

## 2015-12-06 DIAGNOSIS — I2510 Atherosclerotic heart disease of native coronary artery without angina pectoris: Secondary | ICD-10-CM | POA: Diagnosis not present

## 2015-12-06 DIAGNOSIS — Z5181 Encounter for therapeutic drug level monitoring: Secondary | ICD-10-CM | POA: Diagnosis not present

## 2015-12-06 MED ORDER — LISINOPRIL 10 MG PO TABS: 10.0000 mg | ORAL_TABLET | Freq: Every day | ORAL | Status: DC

## 2015-12-06 MED ORDER — METOPROLOL SUCCINATE ER 50 MG PO TB24: 50.0000 mg | ORAL_TABLET | Freq: Every day | ORAL | Status: AC

## 2015-12-06 NOTE — Assessment & Plan Note (Signed)
No chest pain; smoking cessation urged; check lipids; continue aspirin; not sure about how long to continue plavix, has been on 15 years so I won't stop today until we get more information

## 2015-12-06 NOTE — Assessment & Plan Note (Addendum)
With tachycardia; checking TSH; will decrease ACE-I and increase beta-blocker; goal systolic [id number]; close f/u in 2 weeks; patient to check his pulse and BP at home daily and call if not at goal

## 2015-12-06 NOTE — Patient Instructions (Addendum)
Try to limit saturated fats in your diet (bologna, hot dogs, barbeque, cheeseburgers, hamburgers, steak, bacon, sausage, cheese, etc.) and get more fresh fruits, vegetables, and whole grains  Return to the hospital lab on another day for fasting bloodwork  Try turmeric as a natural anti-inflammatory (for pain and arthritis). It comes in capsules where you buy aspirin and fish oil, but also as a spice where you buy pepper and garlic powder.  I'll suggest that you stop the hydrocodone  Okay to use tylenol per package directions  I do encourage you to quit smoking Call [phone] to sign up for smoking cessation classes You can call 1-800-QUIT-NOW to talk with a smoking cessation coach  We will decrease your lisinopril from 20 mg daily to 10 mg daily We will increase your metoprolol from 25 mg daily to 50 mg daily Check your blood pressure and your heart rate every day from now until your next visit and call if blood pressure is less than 110 on top (hold your lisinopril that day) Return in 2 weeks for recheck of your pulse and blood pressure and visit with Dr. Sanda Klein  Smoking Cessation, Tips for Success If you are ready to quit smoking, congratulations! You have chosen to help yourself be healthier. Cigarettes bring nicotine, tar, carbon monoxide, and other irritants into your body. Your lungs, heart, and blood vessels will be able to work better without these poisons. There are many different ways to quit smoking. Nicotine gum, nicotine patches, a nicotine inhaler, or nicotine nasal spray can help with physical craving. Hypnosis, support groups, and medicines help break the habit of smoking. WHAT THINGS CAN I DO TO MAKE QUITTING EASIER?  Here are some tips to help you quit for good:  Pick a date when you will quit smoking completely. Tell all of your friends and family about your plan to quit on that date.  Do not try to slowly cut down on the number of cigarettes you are smoking. Pick a  quit date and quit smoking completely starting on that day.  Throw away all cigarettes.   Clean and remove all ashtrays from your home, work, and car.  On a card, write down your reasons for quitting. Carry the card with you and read it when you get the urge to smoke.  Cleanse your body of nicotine. Drink enough water and fluids to keep your urine clear or pale yellow. Do this after quitting to flush the nicotine from your body.  Learn to predict your moods. Do not let a bad situation be your excuse to have a cigarette. Some situations in your life might tempt you into wanting a cigarette.  Never have "just one" cigarette. It leads to wanting another and another. Remind yourself of your decision to quit.  Change habits associated with smoking. If you smoked while driving or when feeling stressed, try other activities to replace smoking. Stand up when drinking your coffee. Brush your teeth after eating. Sit in a different chair when you read the paper. Avoid alcohol while trying to quit, and try to drink fewer caffeinated beverages. Alcohol and caffeine may urge you to smoke.  Avoid foods and drinks that can trigger a desire to smoke, such as sugary or spicy foods and alcohol.  Ask people who smoke not to smoke around you.  Have something planned to do right after eating or having a cup of coffee. For example, plan to take a walk or exercise.  Try a relaxation exercise to calm  you down and decrease your stress. Remember, you may be tense and nervous for the first 2 weeks after you quit, but this will pass.  Find new activities to keep your hands busy. Play with a pen, coin, or rubber band. Doodle or draw things on paper.  Brush your teeth right after eating. This will help cut down on the craving for the taste of tobacco after meals. You can also try mouthwash.   Use oral substitutes in place of cigarettes. Try using lemon drops, carrots, cinnamon sticks, or chewing gum. Keep them handy  so they are available when you have the urge to smoke.  When you have the urge to smoke, try deep breathing.  Designate your home as a nonsmoking area.  If you are a heavy smoker, ask your health care provider about a prescription for nicotine chewing gum. It can ease your withdrawal from nicotine.  Reward yourself. Set aside the cigarette money you save and buy yourself something nice.  Look for support from others. Join a support group or smoking cessation program. Ask someone at home or at work to help you with your plan to quit smoking.  Always ask yourself, "Do I need this cigarette or is this just a reflex?" Tell yourself, "Today, I choose not to smoke," or "I do not want to smoke." You are reminding yourself of your decision to quit.  Do not replace cigarette smoking with electronic cigarettes (commonly called e-cigarettes). The safety of e-cigarettes is unknown, and some may contain harmful chemicals.  If you relapse, do not give up! Plan ahead and think about what you will do the next time you get the urge to smoke. HOW WILL I FEEL WHEN I QUIT SMOKING? You may have symptoms of withdrawal because your body is used to nicotine (the addictive substance in cigarettes). You may crave cigarettes, be irritable, feel very hungry, cough often, get headaches, or have difficulty concentrating. The withdrawal symptoms are only temporary. They are strongest when you first quit but will go away within 10-14 days. When withdrawal symptoms occur, stay in control. Think about your reasons for quitting. Remind yourself that these are signs that your body is healing and getting used to being without cigarettes. Remember that withdrawal symptoms are easier to treat than the major diseases that smoking can cause.  Even after the withdrawal is over, expect periodic urges to smoke. However, these cravings are generally short lived and will go away whether you smoke or not. Do not smoke! WHAT RESOURCES ARE  AVAILABLE TO HELP ME QUIT SMOKING? Your health care provider can direct you to community resources or hospitals for support, which may include:  Group support.  Education.  Hypnosis.  Therapy.   This information is not intended to replace advice given to you by your health care provider. Make sure you discuss any questions you have with your health care provider.   Document Released: 04/26/2004 Document Revised: 08/19/2014 Document Reviewed: 01/14/2013 Elsevier Interactive Patient Education [employer].

## 2015-12-06 NOTE — Assessment & Plan Note (Signed)
And weight loss; check TSH; close f/u; increasing beta-blocker with his hx of MI, known CAD

## 2015-12-06 NOTE — Assessment & Plan Note (Signed)
Heart rate over 100 today; will double the beta-blocker, check TSH; limit caffeine, urged him to quit smoking

## 2015-12-06 NOTE — Assessment & Plan Note (Signed)
Check cholesterol fasting; goal LDL less than 70; see AVS; try to limit saturated fats

## 2015-12-06 NOTE — Assessment & Plan Note (Signed)
Patient reports no limitations to activities; smoking cessation urged

## 2015-12-06 NOTE — Assessment & Plan Note (Signed)
I do not plan on continuing narcotics in this gentleman; he is welcome to see the orthopaedist for consideration of re-imaging or additional synvisc or cortisone shots, but I don't plan to use controlled substances for his pain; he can use tylenol

## 2015-12-06 NOTE — Assessment & Plan Note (Signed)
Encouraged smoking cessation 

## 2015-12-06 NOTE — Assessment & Plan Note (Signed)
Check multiple labs, including TSH; he just underwent CT scan abd and pelvis in January with urologist; I do not see a chest xray or CT scan recently; will order chest CT

## 2015-12-06 NOTE — Assessment & Plan Note (Signed)
With 11 pound weight loss in less than 3 months; ordering chest CT

## 2015-12-06 NOTE — Assessment & Plan Note (Signed)
Encouraged patient to quit 

## 2015-12-06 NOTE — Progress Notes (Signed)
BP 104/60 mmHg  Pulse 110  Temp(Src) 98.4 F (36.9 C) (Oral)  Resp 14  Wt 175 lb (79.379 kg)  SpO2 94%  Recheck heart rate 100  Subjective:    Patient ID: Bruce Gordon, male    DOB: Jun 28, 1941, 75 y.o.   MRN: [id number]  HPI: Bruce Gordon is a 75 y.o. male  Chief Complaint  Patient presents with  . Follow-up   Patient is here today for f/u, but new to me as his previous provider left this practice; he is here with his wife He checks his BP at home; 130/80; heart rate is high today; some stress from life he says Smokes <1 ppd; pipe tobacco; no allergy pills; has had 3 cups of coffee already this morning Not seeing a heart doctor right now; had stent in heart in 11/27/2000; no chest pain; still on plavix; he had a heart attack, but on the plavix for 15 years without any changes I reviewed last labs; no recent cholesterol that I could see COPD; on mask at night; he has not seen a pulmonologist for years; breathing is not limiting his activities he reports He sees Dr. Erlene Quan for his prostate; hx of prostate cancer I asked why he had a narcotic in his med list; he says that he has no cartilage in the right knee; he does not want a replacement; his last ortho was Dr. Sabra Heck, gave him a shot of cortisone, but it wore off; he had synvisc in 11/27/2008; I offered to refer for another synvisc but pt says insurance won't pay for it; I asked how much of the narcotic he takes; he says that he takes a half of a pill maybe twice a week  Depression screen [facility] 2/9 12/06/2015 03/20/2015  Decreased Interest 0 0  Down, Depressed, Hopeless 1 0  PHQ - 2 Score 1 0   Relevant past medical, surgical, family and social history reviewed and updated as indicated. Past Medical History  Diagnosis Date  . Prostate cancer ([place])   . Primary prostate cancer ([place])   . Cigarette smoker   . Obstructive sleep apnea of adult   . Sleep apnea   . HTN, goal below 150/90   . Coronary artery disease involving native  coronary 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 angina pectoris   . History of myocardial infarction   . COPD, severe ([place])   . Arthritis, multiple joint involvement   . Arthritis   . Chronic pain of right knee   . Hypertriglyceridemia   . Herpes zoster without complication   . Elevated PSA   . Asthma    Past Surgical History  Procedure Laterality Date  . Appendectomy    . Coronary angioplasty with stent placement  December 2002   Family History  Problem Relation Age of Onset  . Hypertension Father   . Aneurysm Brother   brother died from brain aneursym in 1984-11-27 or 1985-11-27 or so  Social History  Substance Use Topics  . Smoking status: Current Every Day Smoker  . Smokeless tobacco: Never Used  . Alcohol Use: No   Interim medical history since last visit reviewed. Allergies and medications reviewed and updated.  Review of Systems Per HPI unless specifically indicated above     Objective:    BP 104/60 mmHg  Pulse 110  Temp(Src) 98.4 F (36.9 C) (Oral)  Resp 14  Wt 175 lb (79.379 kg)  SpO2 94%  Wt Readings from Last 3 Encounters:  12/06/15 175  lb (79.379 kg)  09/15/15 186 lb 8.2 oz (84.6 kg)  09/08/15 185 lb 8 oz (84.142 kg)    Physical Exam  Constitutional: He appears well-developed and well-nourished.  Weight down 11 pounds in last 2 months, 3 weeks  HENT:  Head: Normocephalic and atraumatic.  Eyes: EOM are normal. No scleral icterus.  Neck: No JVD present.  Cardiovascular: Regular rhythm.  Tachycardia present.   Pulmonary/Chest: Effort normal and breath sounds normal. He has no wheezes.  Abdominal: He exhibits no distension.  Musculoskeletal: He exhibits no edema.  Neurological: He is alert.  Skin: No pallor.  Psychiatric: He has a normal mood and affect.   Results for orders placed or performed in visit on 09/15/15  PSA  Result Value Ref Range   PSA 1.85 0.00 - 4.00 ng/mL      Assessment & Plan:   Problem List Items Addressed This Visit       Cardiovascular and Mediastinum   Hypertension goal BP (blood pressure) < 140/90 - Primary    With tachycardia; checking TSH; will decrease ACE-I and increase beta-blocker; goal systolic [id number]; close f/u in 2 weeks; patient to check his pulse and BP at home daily and call if not at goal      Relevant Medications   lisinopril (PRINIVIL,ZESTRIL) 10 MG tablet   metoprolol succinate (TOPROL-XL) 50 MG 24 hr tablet   Coronary artery disease involving native coronary 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 angina pectoris    No chest pain; smoking cessation urged; check lipids; continue aspirin; not sure about how long to continue plavix, has been on 15 years so I won't stop today until we get more information      Relevant Medications   lisinopril (PRINIVIL,ZESTRIL) 10 MG tablet   metoprolol succinate (TOPROL-XL) 50 MG 24 hr tablet   Other Relevant Orders   Lipid Panel w/o Chol/HDL Ratio     Respiratory   COPD, 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[facility])    Encouraged smoking cessation      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[facility])    Patient reports no limitations to activities; smoking cessation urged        Other   Hyperlipidemia LDL goal <70    Check cholesterol fasting; goal LDL less than 70; see AVS; try to limit saturated fats      Relevant Medications   lisinopril (PRINIVIL,ZESTRIL) 10 MG tablet   metoprolol succinate (TOPROL-XL) 50 MG 24 hr tablet   Chronic pain of multiple joints    I do not plan on continuing narcotics in this gentleman; he is welcome to see the orthopaedist for consideration of re-imaging or additional synvisc or cortisone shots, but I don't plan to use controlled substances for his pain; he can use tylenol      Tobacco use disorder    Encouraged patient to quit      History of prostate cancer    With 11 pound weight loss in less than 3 months; ordering chest CT      History of myocardial infarction    Heart rate over 100 today; will double the beta-blocker, check TSH; limit caffeine, urged  him to quit smoking      Medication monitoring encounter   Relevant Orders   Comprehensive metabolic panel   CBC with Differential/Platelet   Tachycardia    And weight loss; check TSH; close f/u; increasing beta-blocker with his hx of MI, known CAD      Relevant Orders   TSH   Weight loss, unintentional  Check multiple labs, including TSH; he just underwent CT scan abd and pelvis in January with urologist; I do not see a chest xray or CT scan recently; will order chest CT         Follow up plan: Return in about 2 weeks (around 12/20/2015) for follow-up.  An after-visit summary was printed and given to the patient at [place].  Please see the patient instructions which may contain other information and recommendations beyond what is mentioned above in the assessment and plan.  Meds ordered this encounter  Medications  . lisinopril (PRINIVIL,ZESTRIL) 10 MG tablet    Sig: Take 1 tablet (10 mg total) by mouth daily. For blood pressure    Dispense:  30 tablet    Refill:  5    Changing from 20 mg total daily to 10 mg daily  . metoprolol succinate (TOPROL-XL) 50 MG 24 hr tablet    Sig: Take 1 tablet (50 mg total) by mouth daily. Take with or immediately following a meal.    Dispense:  30 tablet    Refill:  5    We're increasing this one    Orders Placed This Encounter  Procedures  . Lipid Panel w/o Chol/HDL Ratio  . Comprehensive metabolic panel  . CBC with Differential/Platelet  . TSH

## 2015-12-08 ENCOUNTER — Other Ambulatory Visit
Admission: RE | Admit: 2015-12-08 | Discharge: 2015-12-08 | Disposition: A | Discharge status: Home / Self Care | Payer: Medicare Other | Source: Ambulatory Visit | Attending: Family Medicine | Admitting: Family Medicine

## 2015-12-08 DIAGNOSIS — I2510 Atherosclerotic heart disease of native coronary artery without angina pectoris: Secondary | ICD-10-CM | POA: Insufficient documentation

## 2015-12-08 DIAGNOSIS — R000 Tachycardia, unspecified: Secondary | ICD-10-CM | POA: Diagnosis not present

## 2015-12-08 LAB — LIPID PANEL
CHOL/HDL RATIO: 3.7 ratio
CHOLESTEROL: 193 mg/dL (ref 0–200)
HDL: 52 mg/dL (ref >40)
LDL Cholesterol: 123 mg/dL — ABNORMAL HIGH (ref 0–99)
Triglycerides: 88 mg/dL (ref <150)
VLDL: 18 mg/dL (ref 0–40)

## 2015-12-08 LAB — CBC WITH DIFFERENTIAL/PLATELET
Basophils Absolute: 0 10*3/uL (ref 0–0.1)
Basophils Relative: 0 %
EOS ABS: 0.2 10*3/uL (ref 0–0.7)
Eosinophils Relative: 3 %
HCT: 44.8 % (ref 40.0–52.0)
HEMOGLOBIN: 15.4 g/dL (ref 13.0–18.0)
Lymphocytes Relative: 29 %
Lymphs Abs: 1.7 10*3/uL (ref 1.0–3.6)
MCH: 32.8 pg (ref 26.0–34.0)
MCHC: 34.3 g/dL (ref 32.0–36.0)
MCV: 95.6 fL (ref 80.0–100.0)
MONOS PCT: 13 %
Monocytes Absolute: 0.8 10*3/uL (ref 0.2–1.0)
NEUTROS PCT: 55 %
Neutro Abs: 3.2 10*3/uL (ref 1.4–6.5)
PLATELETS: 172 10*3/uL (ref 150–440)
RBC: 4.69 MIL/uL (ref 4.40–5.90)
RDW: 14.5 % (ref 11.5–14.5)
WBC: 5.9 10*3/uL (ref 3.8–10.6)

## 2015-12-08 LAB — COMPREHENSIVE METABOLIC PANEL
ALK PHOS: 75 U/L (ref 38–126)
ALT: 15 U/L — AB (ref 17–63)
ANION GAP: 9 (ref 5–15)
AST: 29 U/L (ref 15–41)
Albumin: 3.8 g/dL (ref 3.5–5.0)
BILIRUBIN TOTAL: 0.7 mg/dL (ref 0.3–1.2)
BUN: 17 mg/dL (ref 6–20)
CALCIUM: 9.2 mg/dL (ref 8.9–10.3)
CO2: 24 mmol/L (ref 22–32)
CREATININE: 1.05 mg/dL (ref 0.61–1.24)
Chloride: 104 mmol/L (ref 101–111)
GFR calc non Af Amer: >60 mL/min (ref >60)
GLUCOSE: 96 mg/dL (ref 65–99)
Potassium: 4.2 mmol/L (ref 3.5–5.1)
Sodium: 137 mmol/L (ref 135–145)
TOTAL PROTEIN: 7.1 g/dL (ref 6.5–8.1)

## 2015-12-08 LAB — TSH: TSH: 2.089 u[IU]/mL (ref 0.350–4.500)

## 2015-12-18 ENCOUNTER — Ambulatory Visit
Admission: RE | Admit: 2015-12-18 | Discharge: 2015-12-18 | Disposition: A | Discharge status: Home / Self Care | Payer: Medicare Other | Source: Ambulatory Visit | Attending: Family Medicine | Admitting: Family Medicine

## 2015-12-18 DIAGNOSIS — Z8546 Personal history of malignant neoplasm of prostate: Secondary | ICD-10-CM | POA: Insufficient documentation

## 2015-12-18 DIAGNOSIS — R918 Other nonspecific abnormal finding of lung field: Secondary | ICD-10-CM | POA: Diagnosis not present

## 2015-12-18 DIAGNOSIS — J984 Other disorders of lung: Secondary | ICD-10-CM | POA: Insufficient documentation

## 2015-12-18 DIAGNOSIS — Z923 Personal history of irradiation: Secondary | ICD-10-CM | POA: Insufficient documentation

## 2015-12-18 DIAGNOSIS — R634 Abnormal weight loss: Secondary | ICD-10-CM | POA: Diagnosis not present

## 2015-12-18 DIAGNOSIS — F17200 Nicotine dependence, unspecified, uncomplicated: Secondary | ICD-10-CM | POA: Insufficient documentation

## 2015-12-18 MED ORDER — IOPAMIDOL (ISOVUE-300) INJECTION 61%
75.0000 mL | Freq: Once | INTRAVENOUS | Status: AC | PRN
  Administered 2015-12-18: 75 mL via INTRAVENOUS

## 2015-12-20 ENCOUNTER — Encounter: Payer: Self-pay | Admitting: Family Medicine

## 2015-12-20 ENCOUNTER — Ambulatory Visit (INDEPENDENT_AMBULATORY_CARE_PROVIDER_SITE_OTHER): Payer: Medicare Other | Admitting: Family Medicine

## 2015-12-20 VITALS — BP 112/68 | HR 74 | Temp 98.6°F | Resp 16 | Wt 172.0 lb

## 2015-12-20 DIAGNOSIS — I2510 Atherosclerotic heart disease of native coronary artery without angina pectoris: Secondary | ICD-10-CM | POA: Diagnosis not present

## 2015-12-20 DIAGNOSIS — I700 Atherosclerosis of aorta: Secondary | ICD-10-CM

## 2015-12-20 DIAGNOSIS — F17200 Nicotine dependence, unspecified, uncomplicated: Secondary | ICD-10-CM

## 2015-12-20 DIAGNOSIS — M8580 Other specified disorders of bone density and structure, unspecified site: Secondary | ICD-10-CM | POA: Diagnosis not present

## 2015-12-20 DIAGNOSIS — I252 Old myocardial infarction: Secondary | ICD-10-CM | POA: Diagnosis not present

## 2015-12-20 DIAGNOSIS — J432 Centrilobular emphysema: Secondary | ICD-10-CM

## 2015-12-20 DIAGNOSIS — E785 Hyperlipidemia, unspecified: Secondary | ICD-10-CM | POA: Diagnosis not present

## 2015-12-20 DIAGNOSIS — R918 Other nonspecific abnormal finding of lung field: Secondary | ICD-10-CM | POA: Insufficient documentation

## 2015-12-20 DIAGNOSIS — R634 Abnormal weight loss: Secondary | ICD-10-CM

## 2015-12-20 DIAGNOSIS — I10 Essential (primary) hypertension: Secondary | ICD-10-CM | POA: Diagnosis not present

## 2015-12-20 HISTORY — DX: Other nonspecific abnormal finding of lung field: R91.8

## 2015-12-20 HISTORY — DX: Atherosclerosis of aorta: I70.0

## 2015-12-20 HISTORY — DX: Other specified disorders of bone density and structure, unspecified site: M85.80

## 2015-12-20 MED ORDER — ATORVASTATIN CALCIUM 20 MG PO TABS: 20.0000 mg | ORAL_TABLET | Freq: Every day | ORAL | Status: DC

## 2015-12-20 NOTE — Assessment & Plan Note (Signed)
Reviewed on chest CT; discussed with patient; will order follow-up scan in May 2018

## 2015-12-20 NOTE — Assessment & Plan Note (Signed)
Continue niaspan and add statin; recheck lipids in 6 weeks

## 2015-12-20 NOTE — Progress Notes (Signed)
BP 112/68 mmHg  Pulse 74  Temp(Src) 98.6 F (37 C) (Oral)  Resp 16  Wt 172 lb (78.019 kg)  SpO2 97%   Subjective:    Patient ID: Bruce Gordon, male    DOB: 11/27/1940, 75 y.o.   MRN: [id number]  HPI: Bruce Gordon is a 75 y.o. male  Chief Complaint  Patient presents with  . Follow-up    2 weeks review CT results   He is baking and broiling all meats; nothing fried; no french fries; six eggs a week (will try to cut back to 3 after my recommendation); appetite is good No night sweats; more active lately He is not alarmed by the weight loss BP is excellent at home High cholesterol; has been on niacin for years; not on statin; no previous bad reaction Continues on plavix Emphysema; on Spiriva  Chest CT CT Chest W Contrast   Status: Final result       PACS Images     Show images for CT Chest W Contrast     Study Result     CLINICAL DATA: Weight loss of 13 pounds over last 3 months, smoker for 40 years, history prostate cancer, hypertension, coronary artery disease post MI, COPD  EXAM: CT CHEST WITH CONTRAST  TECHNIQUE: Multidetector CT imaging of the chest was performed during intravenous contrast administration. Sagittal and coronal MPR images reconstructed from axial data set.  CONTRAST: 17mL ISOVUE-300 IOPAMIDOL (ISOVUE-300) INJECTION 61% IV  COMPARISON: None  FINDINGS: Atherosclerotic calcifications aorta, proximal great vessels and significantly within coronary arteries.  Aorta normal caliber.  Thoracic vascular structures grossly patent on nondedicated exam.  Scattered normal and upper normal sized mediastinal lymph nodes without definite thoracic adenopathy.  Visualized portion of upper abdomen significant for cysts at the upper pole of the LEFT kidney.  Central lobular emphysematous changes of the upper lobes.  Significant peribronchial thickening bilaterally.  Minimal thickening of interstitial septa in the  RIGHT upper lobe.  2 mm diameter RIGHT upper lobe pulmonary nodule image 51.  2 mm diameter RIGHT upper lobe nodule image 68.  Focus of nodular thickening along the minor fissure 10 x 4 mm diameter sagittal image 44.  No infiltrate, pleural effusion, pneumothorax or additional mass/ nodule.  Bones demineralized.  Old healed fracture RIGHT fifth rib.  IMPRESSION: Emphysematous and moderate bronchitic changes consistent with COPD.  Two separate 2 mm RIGHT upper lobe pulmonary nodules, nonspecific.  10 x 4 mm focal area of thickening at minor fissure, nonspecific; followup CT recommended in 4 months to assess stability.   Electronically Signed  By: Lavonia Dana M.D.  On: 12/18/2015 08:28     Depression screen [facility] 2/9 12/06/2015 03/20/2015  Decreased Interest 0 0  Down, Depressed, Hopeless 1 0  PHQ - 2 Score 1 0   Relevant past medical, surgical, family and social history reviewed Past Medical History  Diagnosis Date  . Prostate cancer ([place])   . Primary prostate cancer ([place])   . Cigarette smoker   . Obstructive sleep apnea of adult   . Sleep apnea   . HTN, goal below 150/90   . 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 History of myocardial infarction   . COPD, severe ([place])   . Arthritis, multiple joint involvement   . Arthritis   . Chronic pain of right knee   . Hypertriglyceridemia   . Herpes zoster without complication   . Elevated PSA   .  Asthma   . Osteopenia determined by x-ray 12/20/2015    Chest CT May 2017  . Pulmonary nodules 12/20/2015    Chest CT May 2017  . Aortic atherosclerosis ([place]) 12/20/2015   Past Surgical History  Procedure Laterality Date  . Appendectomy    . Coronary angioplasty with stent placement  December 2002   Family History  Problem Relation Age of Onset  . Hypertension Father   . Aneurysm Brother    Social History  Substance Use Topics  . Smoking status:  Current Every Day Smoker -- 0.50 packs/day    Types: Pipe, Cigarettes  . Smokeless tobacco: Never Used  . Alcohol Use: No  MD note: patient told me he has cut his cigarette use in half since last visit  Interim medical history since last visit reviewed. Allergies and medications reviewed  Review of Systems Per HPI unless specifically indicated above     Objective:    BP 112/68 mmHg  Pulse 74  Temp(Src) 98.6 F (37 C) (Oral)  Resp 16  Wt 172 lb (78.019 kg)  SpO2 97%  Wt Readings from Last 3 Encounters:  12/20/15 172 lb (78.019 kg)  12/06/15 175 lb (79.379 kg)  09/15/15 186 lb 8.2 oz (84.6 kg)    Physical Exam  Constitutional: He appears well-developed and well-nourished. No distress.  HENT:  Head: Normocephalic and atraumatic.  Eyes: EOM are normal. No scleral icterus.  Neck: No thyromegaly present.  Cardiovascular: Normal rate and regular rhythm.   Pulmonary/Chest: Effort normal and breath sounds normal.  Abdominal: Soft. Bowel sounds are normal. He exhibits no distension.  Musculoskeletal: He exhibits no edema.  Neurological: Coordination normal.  Skin: Skin is warm and dry. Ecchymosis (sun-exposed areas of forearms) noted. No pallor.  Psychiatric: He has a normal mood and affect. His behavior is normal. Judgment and thought content normal.  Very pleasant   Results for orders placed or performed during the hospital encounter of 12/08/15  Lipid panel  Result Value Ref Range   Cholesterol 193 0 - 200 mg/dL   Triglycerides 88 <150 mg/dL   HDL 52 >40 mg/dL   Total CHOL/HDL Ratio 3.7 RATIO   VLDL 18 0 - 40 mg/dL   LDL Cholesterol 123 (H) 0 - 99 mg/dL  Comprehensive metabolic panel  Result Value Ref Range   Sodium 137 135 - 145 mmol/L   Potassium 4.2 3.5 - 5.1 mmol/L   Chloride 104 101 - 111 mmol/L   CO2 24 22 - 32 mmol/L   Glucose, Bld 96 65 - 99 mg/dL   BUN 17 6 - 20 mg/dL   Creatinine, Ser 1.05 0.61 - 1.24 mg/dL   Calcium 9.2 8.9 - 10.3 mg/dL   Total Protein  7.1 6.5 - 8.1 g/dL   Albumin 3.8 3.5 - 5.0 g/dL   AST 29 15 - 41 U/L   ALT 15 (L) 17 - 63 U/L   Alkaline Phosphatase 75 38 - 126 U/L   Total Bilirubin 0.7 0.3 - 1.2 mg/dL   GFR calc non Af Amer >60 >60 mL/min   GFR calc Af Amer >60 >60 mL/min   Anion gap 9 5 - 15  TSH  Result Value Ref Range   TSH 2.089 0.350 - 4.500 uIU/mL  CBC with Differential/Platelet  Result Value Ref Range   WBC 5.9 3.8 - 10.6 K/uL   RBC 4.69 4.40 - 5.90 MIL/uL   Hemoglobin 15.4 13.0 - 18.0 g/dL   HCT 44.8 40.0 - 52.0 %  MCV 95.6 80.0 - 100.0 fL   MCH 32.8 26.0 - 34.0 pg   MCHC 34.3 32.0 - 36.0 g/dL   RDW 14.5 11.5 - 14.5 %   Platelets 172 150 - 440 K/uL   Neutrophils Relative % 55 %   Neutro Abs 3.2 1.4 - 6.5 K/uL   Lymphocytes Relative 29 %   Lymphs Abs 1.7 1.0 - 3.6 K/uL   Monocytes Relative 13 %   Monocytes Absolute 0.8 0.2 - 1.0 K/uL   Eosinophils Relative 3 %   Eosinophils Absolute 0.2 0 - 0.7 K/uL   Basophils Relative 0 %   Basophils Absolute 0.0 0 - 0.1 K/uL      Assessment & Plan:   Problem List Items Addressed This Visit      Cardiovascular and Mediastinum   Aortic atherosclerosis ([place])    Add statin; goal LDL under 70      Relevant Medications   atorvastatin (LIPITOR) 20 MG tablet   Coronary artery disease involving native coronary 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 angina pectoris    Discussed evidence of aortic athero and coronary artery athero on CT scan; patient has no chest pain; he does not want to see a cardiologist right now; continue plavix, beta-blocker; add statin; heart rate is 74; BP is well-controlled; if he changes his mind to see cardiologist, just let me know      Relevant Medications   atorvastatin (LIPITOR) 20 MG tablet   Hypertension goal BP (blood pressure) < 140/90    Well-controlled on ACE-I and beta-blocker      Relevant Medications   atorvastatin (LIPITOR) 20 MG tablet     Respiratory   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HCC)    Doing pretty well on current  regimen; encouraged smoking cessation        Musculoskeletal and Integument   Osteopenia determined by x-ray    Patient says he cannot afford DEXA right now; encouraged 3 servings of calcium daily, fall precautions        Other   History of myocardial infarction    Refer to cardiologist encouraged; offered stress test, but that's not in the budget; maximize medical therapy; on plavix; add statin      Hyperlipidemia LDL goal <70    Continue niaspan and add statin; recheck lipids in 6 weeks      Relevant Medications   atorvastatin (LIPITOR) 20 MG tablet   Pulmonary nodules - Primary    Reviewed on chest CT; discussed with patient; will order follow-up scan in May 2018      Tobacco use disorder    Praise given for cutting cigarette use in half; keep working toward quitting      Weight loss, unintentional    Patient has lost 14 pounds since Feb (3+ months); he is not alarmed, but with history of cancer, I am forwarding my note to his radiation oncologist and urologist; his TSH in April was normal; chest CT done did not show evidence of mets; I will see him back in 6 weeks, but encouraged patient to call if he continues to lose any additional weight         Follow up plan: Return in about 6 weeks (around 01/31/2016) for visit with Dr. Sanda Klein, have labs done the day before.  An after-visit summary was printed and given to the patient at [place].  Please see the patient instructions which may contain other information and recommendations beyond what is mentioned above in the assessment and plan.  Meds  ordered this encounter  Medications  . atorvastatin (LIPITOR) 20 MG tablet    Sig: Take 1 tablet (20 mg total) by mouth at bedtime.    Dispense:  30 tablet    Refill:  2

## 2015-12-20 NOTE — Assessment & Plan Note (Addendum)
Discussed evidence of aortic athero and coronary artery athero on CT scan; patient has no chest pain; he does not want to see a cardiologist right now; continue plavix, beta-blocker; add statin; heart rate is 74; BP is well-controlled; if he changes his mind to see cardiologist, just let me know

## 2015-12-20 NOTE — Assessment & Plan Note (Signed)
Patient says he cannot afford DEXA right now; encouraged 3 servings of calcium daily, fall precautions

## 2015-12-20 NOTE — Assessment & Plan Note (Signed)
Well-controlled on ACE-I and beta-blocker

## 2015-12-20 NOTE — Assessment & Plan Note (Signed)
Praise given for cutting cigarette use in half; keep working toward quitting

## 2015-12-20 NOTE — Assessment & Plan Note (Addendum)
Refer to cardiologist encouraged; offered stress test, but that's not in the budget; maximize medical therapy; on plavix; add statin

## 2015-12-20 NOTE — Assessment & Plan Note (Signed)
Patient has lost 14 pounds since Feb (3+ months); he is not alarmed, but with history of cancer, I am forwarding my note to his radiation oncologist and urologist; his TSH in April was normal; chest CT done did not show evidence of mets; I will see him back in 6 weeks, but encouraged patient to call if he continues to lose any additional weight

## 2015-12-20 NOTE — Assessment & Plan Note (Signed)
Add statin; goal LDL under 70

## 2015-12-20 NOTE — Patient Instructions (Signed)
Try to limit saturated fats in your diet (bologna, hot dogs, barbeque, cheeseburgers, hamburgers, steak, bacon, sausage, cheese, etc.) and get more fresh fruits, vegetables, and whole grains I do encourage you to quit smoking Call [phone] to sign up for smoking cessation classes You can call 1-800-QUIT-NOW to talk with a smoking cessation coach Three servings of calcium a day through diet Add the atorvastatin Have labs done one day before your appointment, and then see me the next day If you continue to lose weight, let me know

## 2015-12-20 NOTE — Assessment & Plan Note (Signed)
Doing pretty well on current regimen; encouraged smoking cessation

## 2016-01-29 ENCOUNTER — Telehealth: Payer: Self-pay | Admitting: Family Medicine

## 2016-01-29 DIAGNOSIS — I700 Atherosclerosis of aorta: Secondary | ICD-10-CM

## 2016-01-29 DIAGNOSIS — Z5181 Encounter for therapeutic drug level monitoring: Secondary | ICD-10-CM

## 2016-01-29 NOTE — Telephone Encounter (Signed)
Patient has appointment scheduled for Wednesday and is wanting to come in tomorrow to do his lab work. Would like to pick up lab order in the morning around 8am. Per his last visit, he was told to come day before appointment to do labs.

## 2016-01-30 ENCOUNTER — Other Ambulatory Visit: Payer: Self-pay

## 2016-01-30 DIAGNOSIS — Z5181 Encounter for therapeutic drug level monitoring: Secondary | ICD-10-CM

## 2016-01-30 DIAGNOSIS — I700 Atherosclerosis of aorta: Secondary | ICD-10-CM

## 2016-01-30 LAB — LIPID PANEL
Cholesterol: 118 mg/dL — ABNORMAL LOW (ref 125–200)
HDL: 49 mg/dL (ref >=40)
LDL Cholesterol: 57 mg/dL (ref <130)
Total CHOL/HDL Ratio: 2.4 Ratio (ref <=5.0)
Triglycerides: 60 mg/dL (ref <150)
VLDL: 12 mg/dL (ref <30)

## 2016-01-30 LAB — ALT: ALT: 15 U/L (ref 9–46)

## 2016-01-30 NOTE — Assessment & Plan Note (Signed)
Check sgpt 

## 2016-01-30 NOTE — Telephone Encounter (Signed)
Okay, orders ready

## 2016-01-30 NOTE — Assessment & Plan Note (Signed)
Check lipids 

## 2016-01-31 ENCOUNTER — Encounter: Payer: Self-pay | Admitting: Family Medicine

## 2016-01-31 ENCOUNTER — Ambulatory Visit (INDEPENDENT_AMBULATORY_CARE_PROVIDER_SITE_OTHER): Payer: Medicare Other | Admitting: Family Medicine

## 2016-01-31 VITALS — BP 116/64 | HR 76 | Temp 98.4°F | Resp 16 | Wt 164.0 lb

## 2016-01-31 DIAGNOSIS — I700 Atherosclerosis of aorta: Secondary | ICD-10-CM | POA: Diagnosis not present

## 2016-01-31 DIAGNOSIS — I10 Essential (primary) hypertension: Secondary | ICD-10-CM

## 2016-01-31 DIAGNOSIS — I2510 Atherosclerotic heart disease of native coronary artery without angina pectoris: Secondary | ICD-10-CM | POA: Diagnosis not present

## 2016-01-31 DIAGNOSIS — E785 Hyperlipidemia, unspecified: Secondary | ICD-10-CM | POA: Diagnosis not present

## 2016-01-31 DIAGNOSIS — F17200 Nicotine dependence, unspecified, uncomplicated: Secondary | ICD-10-CM | POA: Diagnosis not present

## 2016-01-31 MED ORDER — ATORVASTATIN CALCIUM 20 MG PO TABS: 10.0000 mg | ORAL_TABLET | Freq: Every day | ORAL | Status: AC

## 2016-01-31 NOTE — Assessment & Plan Note (Signed)
Continue statin but at lower dose, continue healthy eating habits; try to quit smoking

## 2016-01-31 NOTE — Assessment & Plan Note (Signed)
Cholesterol dropped so significantly, will decrease statin and recheck in 6 months

## 2016-01-31 NOTE — Progress Notes (Signed)
BP 116/64 mmHg  Pulse 76  Temp(Src) 98.4 F (36.9 C) (Oral)  Resp 16  Wt 164 lb (74.39 kg)  SpO2 98%   Subjective:    Patient ID: Bruce Gordon, male    DOB: 05/03/1941, 75 y.o.   MRN: [id number]  HPI: Bruce Gordon is a 75 y.o. male  Chief Complaint  Patient presents with  . Follow-up    6 weeks   Blood pressure is doing well; he checks it regularly; always under 140/80; heart beat under 80; trying to limit salt; does not feel dizzy; feels good where it is  High cholesterol; baked and broiling foods; eating salads 3x a week; cut back on eggs, maybe 2 eggs twice a week; cut back on fatty meats; started atorvastatin (lipitor) last time he was here; LDL dropped like a rock Lab Results  Component Value Date   CHOL 118* 01/30/2016   CHOL 193 12/08/2015   Lab Results  Component Value Date   HDL 49 01/30/2016   HDL 52 12/08/2015   Lab Results  Component Value Date   LDLCALC 57 01/30/2016   LDLCALC 123* 12/08/2015   Lab Results  Component Value Date   TRIG 60 01/30/2016   TRIG 88 12/08/2015   Lab Results  Component Value Date   CHOLHDL 2.4 01/30/2016   CHOLHDL 3.7 12/08/2015   No results found for: LDLDIRECT  Relevant past medical, surgical, family and social history reviewed Past Medical History  Diagnosis Date  . Prostate cancer ([place])   . Primary prostate cancer ([place])   . Cigarette smoker   . Obstructive sleep apnea of adult   . Sleep apnea   . HTN, goal below 150/90   . 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 History of myocardial infarction   . COPD, severe ([place])   . Arthritis, multiple joint involvement   . Arthritis   . Chronic pain of right knee   . Hypertriglyceridemia   . Herpes zoster without complication   . Elevated PSA   . Asthma   . Osteopenia determined by x-ray 12/20/2015    Chest CT May 2017  . Pulmonary nodules 12/20/2015    Chest CT May 2017  . Aortic  atherosclerosis ([place]) 12/20/2015   Social History  Substance Use Topics  . Smoking status: Current Every Day Smoker -- 0.50 packs/day    Types: Pipe, Cigarettes  . Smokeless tobacco: Never Used  . Alcohol Use: No   Interim medical history since last visit reviewed. Allergies and medications reviewed  Review of Systems Per HPI unless specifically indicated above     Objective:    BP 116/64 mmHg  Pulse 76  Temp(Src) 98.4 F (36.9 C) (Oral)  Resp 16  Wt 164 lb (74.39 kg)  SpO2 98%  Wt Readings from Last 3 Encounters:  01/31/16 164 lb (74.39 kg)  12/20/15 172 lb (78.019 kg)  12/06/15 175 lb (79.379 kg)    Physical Exam  Constitutional: He appears well-developed and well-nourished. No distress.  Eyes: No scleral icterus.  Neck: No JVD present.  Cardiovascular: Normal rate and regular rhythm.   Pulmonary/Chest: Effort normal and breath sounds normal.  Skin: Ecchymosis (sun-exposed parts of the arms) noted. No pallor.  Psychiatric: He has a normal mood and affect.    Results for orders placed or performed in visit on 01/30/16  Lipid Profile  Result Value Ref Range   Cholesterol 118 (L) 125 -  200 mg/dL   Triglycerides 60 <150 mg/dL   HDL 49 >=40 mg/dL   Total CHOL/HDL Ratio 2.4 <=5.0 Ratio   VLDL 12 <30 mg/dL   LDL Cholesterol 57 <130 mg/dL  ALT  Result Value Ref Range   ALT 15 9 - 46 U/L      Assessment & Plan:   Problem List Items Addressed This Visit      Cardiovascular and Mediastinum   Aortic atherosclerosis ([place]) - Primary    Continue statin but at lower dose, continue healthy eating habits; try to quit smoking      Relevant Medications   atorvastatin (LIPITOR) 20 MG tablet   Other Relevant Orders   Ambulatory referral to Cardiology   Coronary artery disease involving native coronary 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 angina pectoris    Refer to cardiologist to get foot in the door; no chest pain      Relevant Medications   atorvastatin (LIPITOR) 20 MG  tablet   Other Relevant Orders   Ambulatory referral to Cardiology   Hypertension goal BP (blood pressure) < 140/90    Excellent control; continue meds      Relevant Medications   atorvastatin (LIPITOR) 20 MG tablet     Other   Hyperlipidemia LDL goal <70    Cholesterol dropped so significantly, will decrease statin and recheck in 6 months      Relevant Medications   atorvastatin (LIPITOR) 20 MG tablet   Tobacco use disorder    Encouraged him to consider quitting; see AVS         Follow up plan: Return in about 6 months (around 08/01/2016) for visit and fasting labs (okay to come day before for labs).  An after-visit summary was printed and given to the patient at [place].  Please see the patient instructions which may contain other information and recommendations beyond what is mentioned above in the assessment and plan.  Meds ordered this encounter  Medications  . atorvastatin (LIPITOR) 20 MG tablet    Sig: Take 0.5 tablets (10 mg total) by mouth at bedtime.    Dispense:  45 tablet    Refill:  1    We're decreasing the dose; he is doing so well!    Orders Placed This Encounter  Procedures  . Ambulatory referral to Cardiology

## 2016-01-31 NOTE — Assessment & Plan Note (Signed)
Excellent control; continue meds 

## 2016-01-31 NOTE — Assessment & Plan Note (Signed)
Encouraged him to consider quitting; see AVS

## 2016-01-31 NOTE — Assessment & Plan Note (Signed)
Refer to cardiologist to get foot in the door; no chest pain

## 2016-01-31 NOTE — Patient Instructions (Addendum)
Decrease the atorvastatin (Lipitor) to just half of a pill before bed Continue the better diet that you are eating Try to limit saturated fats in your diet (bologna, hot dogs, barbeque, cheeseburgers, hamburgers, steak, bacon, sausage, cheese, etc.) and get more fresh fruits, vegetables, and whole grains Return in 6 months I do encourage you to quit smoking Call [phone] to sign up for smoking cessation classes You can call 1-800-QUIT-NOW to talk with a smoking cessation coach  Smoking Cessation, Tips for Success If you are ready to quit smoking, congratulations! You have chosen to help yourself be healthier. Cigarettes bring nicotine, tar, carbon monoxide, and other irritants into your body. Your lungs, heart, and blood vessels will be able to work better without these poisons. There are many different ways to quit smoking. Nicotine gum, nicotine patches, a nicotine inhaler, or nicotine nasal spray can help with physical craving. Hypnosis, support groups, and medicines help break the habit of smoking. WHAT THINGS CAN I DO TO MAKE QUITTING EASIER?  Here are some tips to help you quit for good:  Pick a date when you will quit smoking completely. Tell all of your friends and family about your plan to quit on that date.  Do not try to slowly cut down on the number of cigarettes you are smoking. Pick a quit date and quit smoking completely starting on that day.  Throw away all cigarettes.   Clean and remove all ashtrays from your home, work, and car.  On a card, write down your reasons for quitting. Carry the card with you and read it when you get the urge to smoke.  Cleanse your body of nicotine. Drink enough water and fluids to keep your urine clear or pale yellow. Do this after quitting to flush the nicotine from your body.  Learn to predict your moods. Do not let a bad situation be your excuse to have a cigarette. Some situations in your life might tempt you into wanting a  cigarette.  Never have "just one" cigarette. It leads to wanting another and another. Remind yourself of your decision to quit.  Change habits associated with smoking. If you smoked while driving or when feeling stressed, try other activities to replace smoking. Stand up when drinking your coffee. Brush your teeth after eating. Sit in a different chair when you read the paper. Avoid alcohol while trying to quit, and try to drink fewer caffeinated beverages. Alcohol and caffeine may urge you to smoke.  Avoid foods and drinks that can trigger a desire to smoke, such as sugary or spicy foods and alcohol.  Ask people who smoke not to smoke around you.  Have something planned to do right after eating or having a cup of coffee. For example, plan to take a walk or exercise.  Try a relaxation exercise to calm you down and decrease your stress. Remember, you may be tense and nervous for the first 2 weeks after you quit, but this will pass.  Find new activities to keep your hands busy. Play with a pen, coin, or rubber band. Doodle or draw things on paper.  Brush your teeth right after eating. This will help cut down on the craving for the taste of tobacco after meals. You can also try mouthwash.   Use oral substitutes in place of cigarettes. Try using lemon drops, carrots, cinnamon sticks, or chewing gum. Keep them handy so they are available when you have the urge to smoke.  When you have the urge to  smoke, try deep breathing.  Designate your home as a nonsmoking area.  If you are a heavy smoker, ask your health care provider about a prescription for nicotine chewing gum. It can ease your withdrawal from nicotine.  Reward yourself. Set aside the cigarette money you save and buy yourself something nice.  Look for support from others. Join a support group or smoking cessation program. Ask someone at home or at work to help you with your plan to quit smoking.  Always ask yourself, "Do I need this  cigarette or is this just a reflex?" Tell yourself, "Today, I choose not to smoke," or "I do not want to smoke." You are reminding yourself of your decision to quit.  Do not replace cigarette smoking with electronic cigarettes (commonly called e-cigarettes). The safety of e-cigarettes is unknown, and some may contain harmful chemicals.  If you relapse, do not give up! Plan ahead and think about what you will do the next time you get the urge to smoke. HOW WILL I FEEL WHEN I QUIT SMOKING? You may have symptoms of withdrawal because your body is used to nicotine (the addictive substance in cigarettes). You may crave cigarettes, be irritable, feel very hungry, cough often, get headaches, or have difficulty concentrating. The withdrawal symptoms are only temporary. They are strongest when you first quit but will go away within 10-14 days. When withdrawal symptoms occur, stay in control. Think about your reasons for quitting. Remind yourself that these are signs that your body is healing and getting used to being without cigarettes. Remember that withdrawal symptoms are easier to treat than the major diseases that smoking can cause.  Even after the withdrawal is over, expect periodic urges to smoke. However, these cravings are generally short lived and will go away whether you smoke or not. Do not smoke! WHAT RESOURCES ARE AVAILABLE TO HELP ME QUIT SMOKING? Your health care provider can direct you to community resources or hospitals for support, which may include:  Group support.  Education.  Hypnosis.  Therapy.   This information is not intended to replace advice given to you by your health care provider. Make sure you discuss any questions you have with your health care provider.   Document Released: 04/26/2004 Document Revised: 08/19/2014 Document Reviewed: 01/14/2013 Elsevier Interactive Patient Education [employer].

## 2016-02-06 ENCOUNTER — Other Ambulatory Visit: Payer: Self-pay

## 2016-02-07 MED ORDER — NIACIN ER (ANTIHYPERLIPIDEMIC) 1000 MG PO TBCR: 1000.0000 mg | EXTENDED_RELEASE_TABLET | Freq: Every day | ORAL | Status: DC

## 2016-03-04 ENCOUNTER — Ambulatory Visit (INDEPENDENT_AMBULATORY_CARE_PROVIDER_SITE_OTHER): Payer: Medicare Other | Admitting: Family Medicine

## 2016-03-04 ENCOUNTER — Encounter: Payer: Self-pay | Admitting: Family Medicine

## 2016-03-04 DIAGNOSIS — F17200 Nicotine dependence, unspecified, uncomplicated: Secondary | ICD-10-CM

## 2016-03-04 DIAGNOSIS — R918 Other nonspecific abnormal finding of lung field: Secondary | ICD-10-CM | POA: Diagnosis not present

## 2016-03-04 DIAGNOSIS — R634 Abnormal weight loss: Secondary | ICD-10-CM

## 2016-03-04 DIAGNOSIS — Z8546 Personal history of malignant neoplasm of prostate: Secondary | ICD-10-CM

## 2016-03-04 DIAGNOSIS — L309 Dermatitis, unspecified: Secondary | ICD-10-CM | POA: Diagnosis not present

## 2016-03-04 LAB — COMPLETE METABOLIC PANEL WITH GFR
ALBUMIN: 4.1 g/dL (ref 3.6–5.1)
ALK PHOS: 83 U/L (ref 40–115)
ALT: 18 U/L (ref 9–46)
AST: 28 U/L (ref 10–35)
BUN: 19 mg/dL (ref 7–25)
CALCIUM: 9.5 mg/dL (ref 8.6–10.3)
CO2: 27 mmol/L (ref 20–31)
CREATININE: 1.1 mg/dL (ref 0.70–1.18)
Chloride: 104 mmol/L (ref 98–110)
GFR, EST AFRICAN AMERICAN: 77 mL/min (ref >=60)
GFR, Est Non African American: 66 mL/min (ref >=60)
Glucose, Bld: 82 mg/dL (ref 65–99)
POTASSIUM: 5.7 mmol/L — AB (ref 3.5–5.3)
Sodium: 140 mmol/L (ref 135–146)
Total Bilirubin: 0.6 mg/dL (ref 0.2–1.2)
Total Protein: 7 g/dL (ref 6.1–8.1)

## 2016-03-04 LAB — CBC WITH DIFFERENTIAL/PLATELET
BASOS ABS: 0 {cells}/uL (ref 0–200)
Basophils Relative: 0 %
EOS ABS: 256 {cells}/uL (ref 15–500)
EOS PCT: 4 %
HCT: 46.6 % (ref 38.5–50.0)
Hemoglobin: 16 g/dL (ref 13.2–17.1)
Lymphocytes Relative: 33 %
Lymphs Abs: 2112 cells/uL (ref 850–3900)
MCH: 33.3 pg — AB (ref 27.0–33.0)
MCHC: 34.3 g/dL (ref 32.0–36.0)
MCV: 96.9 fL (ref 80.0–100.0)
MONOS PCT: 12 %
MPV: 10.3 fL (ref 7.5–12.5)
Monocytes Absolute: 768 cells/uL (ref 200–950)
NEUTROS ABS: 3264 {cells}/uL (ref 1500–7800)
NEUTROS PCT: 51 %
PLATELETS: 189 10*3/uL (ref 140–400)
RBC: 4.81 MIL/uL (ref 4.20–5.80)
RDW: 14.5 % (ref 11.0–15.0)
WBC: 6.4 10*3/uL (ref 3.8–10.8)

## 2016-03-04 LAB — T4, FREE: FREE T4: 1.1 ng/dL (ref 0.8–1.8)

## 2016-03-04 LAB — TSH: TSH: 1.57 m[IU]/L (ref 0.40–4.50)

## 2016-03-04 LAB — C-REACTIVE PROTEIN: CRP: 0.8 mg/dL — AB (ref <0.60)

## 2016-03-04 NOTE — Assessment & Plan Note (Signed)
Patient reports similar rash a year ago, but crossed midline per his report; no stinging, burning, tingling, or pain with this rash, and it crosses multiple dermatomes on left and right side of the body; doubt shingles; if malignancy and he did in fact have disseminate zoster, would have expected it to be more symptomatic; it appears to be resolving; he will continue antibiotic ointment where needed, but call me if recurrent

## 2016-03-04 NOTE — Assessment & Plan Note (Signed)
Patient not quite ready to quit; he is slowing down on cigarettes; I am here to help if/when needed

## 2016-03-04 NOTE — Assessment & Plan Note (Signed)
rescan in September per radiologist, but will get PET scan sooner as he's losing weight

## 2016-03-04 NOTE — Progress Notes (Signed)
BP 122/60   Pulse 79   Temp 98.4 F (36.9 C) (Oral)   Resp 14   Wt 157 lb (71.2 kg)   SpO2 96%   BMI 23.87 kg/m    Subjective:    Patient ID: Bruce Gordon, male    DOB: 06-23-1941, 75 y.o.   MRN: [id number]  HPI: Bruce Gordon is a 75 y.o. male  Chief Complaint  Patient presents with  . Rash    shingles  . Medication Refill   Patient is here for an acute visit; thinks he might have shingles again; one year ago or more, had it across the stomach and back on left and right sides; doctor said shingles  Rash: Patient complains of rash involving the generalized. Rash started 7 days ago. Appearance of rash at onset: Color of lesion(s): red. Rash has not changed over time Initial distribution: abdomen and generalized.  Discomfort associated with rash: causes no discomfort.  Associated symptoms: none. Denies: abdominal pain, arthralgia, cough, decrease in appetite, decrease in energy level, fever, headache, myalgia, sore throat and vomiting. Patient has had previous evaluation of rash. Patient has had previous treatment.  Response to treatment: pills last year, slowly healed. Patient has not had contacts with similar rash. Patient has not identified precipitant. Patient has not had new exposures (soaps, lotions, laundry detergents, foods, medications, plants, insects or animals.)  He is still losing weight; eating like a horse; eats two good meals and a snack during the day; nothing fried, healthy good; no pain anywhere; he had a CT scan done, chest (May 2017, due for rescan in Sept) and abdomen and pelvis (Jan 2017); no confusion, no headaches, nothing worrisome he thinks with his brain; no worrisome moles; Dr. Erlene Quan watching prostate; no night sweats; feels good  Depression screen [facility] 2/9 03/04/2016 12/06/2015 03/20/2015  Decreased Interest 0 0 0  Down, Depressed, Hopeless 0 1 0  PHQ - 2 Score 0 1 0   Relevant past medical, surgical, family and social history reviewed Past  Medical History:  Diagnosis Date  . Aortic atherosclerosis ([place]) 12/20/2015  . Arthritis   . Arthritis, multiple joint involvement   . Asthma   . Chronic pain of right knee   . Cigarette smoker   . COPD, severe ([place])   . 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 Elevated PSA   . Herpes zoster without complication   . History of myocardial infarction   . HTN, goal below 150/90   . Hypertriglyceridemia   . Obstructive sleep apnea of adult   . Osteopenia determined by x-ray 12/20/2015   Chest CT May 2017  . Primary prostate cancer ([place])   . Prostate cancer ([place])   . Pulmonary nodules 12/20/2015   Chest CT May 2017  . Sleep apnea    Past Surgical History:  Procedure Laterality Date  . APPENDECTOMY    . CORONARY ANGIOPLASTY WITH STENT PLACEMENT  December 2002   Family History  Problem Relation Age of Onset  . Hypertension Father   . Aneurysm Brother    Social History  Substance Use Topics  . Smoking status: Current Every Day Smoker    Packs/day: 0.50    Types: Pipe, Cigarettes  . Smokeless tobacco: Never Used  . Alcohol use No  MD note: wife passed away; he does not think he is depressed, though  Interim medical history since last visit reviewed. Allergies and medications reviewed  Review  of Systems Per HPI unless specifically indicated above     Objective:    BP 122/60   Pulse 79   Temp 98.4 F (36.9 C) (Oral)   Resp 14   Wt 157 lb (71.2 kg)   SpO2 96%   BMI 23.87 kg/m   Wt Readings from Last 3 Encounters:  03/04/16 157 lb (71.2 kg)  01/31/16 164 lb (74.4 kg)  12/20/15 172 lb (78 kg)    Physical Exam  Constitutional: He appears well-developed and well-nourished. No distress.  Weight down 7 pounds in just over one month  Eyes: No scleral icterus.  Neck: No JVD present.  Cardiovascular: Normal rate and regular rhythm.   Pulmonary/Chest: Effort normal and breath sounds normal.  Skin: Ecchymosis  (sun-exposed parts of the arms) and rash (diffuse erythematous rash on the arms, chest, abdomen, and back; some peeling eschars; no vesicles; left and right sides covering multiple dermatomes, NOT typical for zoster) noted. No pallor.  Psychiatric: He has a normal mood and affect. His mood appears not anxious. He is not agitated and not slowed. Cognition and memory are not impaired. He does not express impulsivity. He does not exhibit a depressed mood. He expresses no homicidal and no suicidal ideation.  Good eye contact with examiner He is attentive.   Results for orders placed or performed in visit on 01/30/16  Lipid Profile  Result Value Ref Range   Cholesterol 118 (L) 125 - 200 mg/dL   Triglycerides 60 <150 mg/dL   HDL 49 >=40 mg/dL   Total CHOL/HDL Ratio 2.4 <=5.0 Ratio   VLDL 12 <30 mg/dL   LDL Cholesterol 57 <130 mg/dL  ALT  Result Value Ref Range   ALT 15 9 - 46 U/L      Assessment & Plan:   Problem List Items Addressed This Visit      Musculoskeletal and Integument   Dermatitis    Patient reports similar rash a year ago, but crossed midline per his report; no stinging, burning, tingling, or pain with this rash, and it crosses multiple dermatomes on left and right side of the body; doubt shingles; if malignancy and he did in fact have disseminate zoster, would have expected it to be more symptomatic; it appears to be resolving; he will continue antibiotic ointment where needed, but call me if recurrent        Other   Weight loss, unintentional    Recheck labs and then order PET scan if no identifiable reason for his weight loss; he denies depression; will see if hyperthyroid; hx of prostate cancer and smoking and chest CT abnormality all raise red flags; he will continue to try to eat well; he denies depression as cause of his weight loss      Relevant Orders   CBC with Differential/Platelet   C-reactive protein   COMPLETE METABOLIC PANEL WITH GFR   TSH   T4, free   NM  PET Tum Img Whole Body   Tobacco use disorder    Patient not quite ready to quit; he is slowing down on cigarettes; I am here to help if/when needed      Pulmonary nodules    rescan in September per radiologist, but will get PET scan sooner as he's losing weight      History of prostate cancer    Recheck PSA and then get a PET scan if no evidence of anything obvious      Relevant Orders   PSA   NM  PET Tum Img Whole Body    Other Visit Diagnoses   None.     Follow up plan: Return in about 4 weeks (around 04/01/2016) for weight check.  An after-visit summary was printed and given to the patient at [place].  Please see the patient instructions which may contain other information and recommendations beyond what is mentioned above in the assessment and plan.  No orders of the defined types were placed in this encounter.   Orders Placed This Encounter  Procedures  . NM PET Tum Img Whole Body  . CBC with Differential/Platelet  . C-reactive protein  . PSA  . COMPLETE METABOLIC PANEL WITH GFR  . TSH  . T4, free

## 2016-03-04 NOTE — Assessment & Plan Note (Addendum)
Recheck labs and then order PET scan if no identifiable reason for his weight loss; he denies depression; will see if hyperthyroid; hx of prostate cancer and smoking and chest CT abnormality all raise red flags; he will continue to try to eat well; he denies depression as cause of his weight loss

## 2016-03-04 NOTE — Assessment & Plan Note (Signed)
Recheck PSA and then get a PET scan if no evidence of anything obvious

## 2016-03-04 NOTE — Patient Instructions (Addendum)
Let's get labs today Okay to use antibiotic ointment on any sores We'll get a PET scan next week unless we discover a reason for your weight loss before then Let me know of any new symptoms You will be due for another scan of your chest in September (call if you have not heard about that order by September 1st), because it will be due around September 8th

## 2016-03-05 ENCOUNTER — Other Ambulatory Visit: Payer: Self-pay

## 2016-03-05 ENCOUNTER — Other Ambulatory Visit
Admission: RE | Admit: 2016-03-05 | Discharge: 2016-03-05 | Disposition: A | Discharge status: Home / Self Care | Payer: Medicare Other | Source: Ambulatory Visit | Attending: Family Medicine | Admitting: Family Medicine

## 2016-03-05 ENCOUNTER — Other Ambulatory Visit: Payer: Self-pay | Admitting: Family Medicine

## 2016-03-05 DIAGNOSIS — E875 Hyperkalemia: Secondary | ICD-10-CM | POA: Diagnosis not present

## 2016-03-05 LAB — COMPREHENSIVE METABOLIC PANEL
ALT: 19 U/L (ref 17–63)
AST: 32 U/L (ref 15–41)
Albumin: 4 g/dL (ref 3.5–5.0)
Alkaline Phosphatase: 70 U/L (ref 38–126)
Anion gap: 7 (ref 5–15)
BILIRUBIN TOTAL: 0.9 mg/dL (ref 0.3–1.2)
BUN: 20 mg/dL (ref 6–20)
CO2: 30 mmol/L (ref 22–32)
CREATININE: 0.96 mg/dL (ref 0.61–1.24)
Calcium: 9.6 mg/dL (ref 8.9–10.3)
Chloride: 105 mmol/L (ref 101–111)
GFR calc Af Amer: >60 mL/min (ref >60)
Glucose, Bld: 90 mg/dL (ref 65–99)
Potassium: 5.4 mmol/L — ABNORMAL HIGH (ref 3.5–5.1)
Sodium: 142 mmol/L (ref 135–145)
TOTAL PROTEIN: 7.6 g/dL (ref 6.5–8.1)

## 2016-03-05 LAB — PSA: PSA: 0.85 ng/mL (ref <=4.00)

## 2016-03-05 MED ORDER — LISINOPRIL 10 MG PO TABS: 5.0000 mg | ORAL_TABLET | Freq: Every day | ORAL | 2 refills | Status: DC

## 2016-03-06 ENCOUNTER — Encounter: Payer: Self-pay | Admitting: Cardiology

## 2016-03-06 ENCOUNTER — Ambulatory Visit (INDEPENDENT_AMBULATORY_CARE_PROVIDER_SITE_OTHER): Payer: Medicare Other | Admitting: Cardiology

## 2016-03-06 VITALS — BP 140/72 | HR 83 | Ht 68.0 in | Wt 160.5 lb

## 2016-03-06 DIAGNOSIS — E785 Hyperlipidemia, unspecified: Secondary | ICD-10-CM

## 2016-03-06 DIAGNOSIS — I252 Old myocardial infarction: Secondary | ICD-10-CM

## 2016-03-06 DIAGNOSIS — F17200 Nicotine dependence, unspecified, uncomplicated: Secondary | ICD-10-CM

## 2016-03-06 DIAGNOSIS — I10 Essential (primary) hypertension: Secondary | ICD-10-CM | POA: Diagnosis not present

## 2016-03-06 DIAGNOSIS — I2510 Atherosclerotic heart disease of native coronary artery without angina pectoris: Secondary | ICD-10-CM | POA: Diagnosis not present

## 2016-03-06 NOTE — Patient Instructions (Signed)
Testing/Procedures: Your physician has requested that you have an echocardiogram. Echocardiography is a painless test that uses sound waves to create images of your heart. It provides your doctor with information about the size and shape of your heart and how well your heart's chambers and valves are working. This procedure takes approximately one hour. There are no restrictions for this procedure.  Follow-Up: Your physician wants you to follow-up in: 6 months with Dr. Yvone Neu. You will receive a reminder letter in the mail two months in advance. If you don't receive a letter, please call our office to schedule the follow-up appointment.  It was a pleasure seeing you today here in the office. Please do not hesitate to give [place] a call back if you have any further questions. [place], BSN     Any Other Special Instructions Will Be Listed Below (If Applicable). We need to get your outside medical records.

## 2016-03-06 NOTE — Progress Notes (Signed)
Cardiology Office Note   Date:  03/06/2016   ID:  Bruce Gordon, DOB [birth year], MRN [id number]  Referring Doctor:  Enid Derry, MD   Cardiologist:   Wende Bushy, MD   Reason for consultation:  Chief Complaint  Patient presents with  . Other    Ref by Dr. Sanda Klein to establish care for CAD; stent placement in 2002. Meds reviewed by the patient verbally.       History of Present Illness: Bruce Gordon is a 75 y.o. male who presents for Establishing care for CAD.  Patient was diagnosed with CAD status post MI and stent placement to the mid LAD, 08/11/2001. He lives in [place] at that time and was seeing a cardiologist in the area. It is not seen a heart doctor since 2010. He continues to take his medications. He denies chest pain and shortness of breath. He is not physically active. However, he denies chest pain or shortness of breath with some minimal walking is able to do.  Patient denies PND, orthopnea, edema. No syncope. No fever, cough, colds, abdominal pain.  ROS:  Please see the history of present illness. Aside from mentioned under HPI, all other systems are reviewed and negative.     Past Medical History:  Diagnosis Date  . Aortic atherosclerosis ([place]) 12/20/2015  . Arthritis   . Arthritis, multiple joint involvement   . Asthma   . Chronic pain of right knee   . Cigarette smoker   . COPD, severe ([place])   . Coronary artery disease involving native coronary 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 angina pectoris 2002  . Elevated PSA   . Herpes zoster without complication   . History of myocardial infarction   . HTN, goal below 150/90   . Hypertriglyceridemia   . Obstructive sleep apnea of adult   . Osteopenia determined by x-ray 12/20/2015   Chest CT May 2017  . Primary prostate cancer ([place])   . Prostate cancer ([place])   . Pulmonary nodules 12/20/2015   Chest CT May 2017  . Sleep apnea     Past Surgical History:  Procedure Laterality Date  . APPENDECTOMY     . CORONARY ANGIOPLASTY WITH STENT PLACEMENT  December 2002     reports that he has been smoking Pipe and Cigarettes.  He has been smoking about 0.50 packs per day. He has never used smokeless tobacco. He reports that he does not drink alcohol or use drugs.   family history includes Aneurysm in his brother; Hypertension in his father.   Outpatient Medications Prior to Visit  Medication Sig Dispense Refill  . aspirin 81 MG tablet Take 81 mg by mouth daily.    Marland Kitchen atorvastatin (LIPITOR) 20 MG tablet Take 0.5 tablets (10 mg total) by mouth at bedtime. 45 tablet 1  . clopidogrel (PLAVIX) 75 MG tablet TAKE 1 TABLET BY MOUTH EVERY DAY 90 tablet 3  . lisinopril (PRINIVIL,ZESTRIL) 10 MG tablet Take 0.5 tablets (5 mg total) by mouth daily. For blood pressure 15 tablet 2  . metoprolol succinate (TOPROL-XL) 50 MG 24 hr tablet Take 1 tablet (50 mg total) by mouth daily. Take with or immediately following a meal. 30 tablet 5  . niacin (NIASPAN) 1000 MG CR tablet Take 1 tablet (1,000 mg total) by mouth daily. 90 tablet 1  . SPIRIVA HANDIHALER 18 MCG inhalation capsule PLACE 1 CAPSULE (18 MCG TOTAL) INTO INHALER AND INHALE DAILY. 90 capsule 1   No facility-administered medications prior to visit.  Allergies: Review of patient's allergies indicates no known allergies.    PHYSICAL EXAM: VS:  BP 140/72 (BP Location: Left Arm, Patient Position: Sitting, Cuff Size: Normal)   Pulse 83   Ht 5\' 8"  (1.727 m)   Wt 160 lb 8 oz (72.8 kg)   BMI 24.40 kg/m  , Body mass index is 24.4 kg/m. Wt Readings from Last 3 Encounters:  03/06/16 160 lb 8 oz (72.8 kg)  03/04/16 157 lb (71.2 kg)  01/31/16 164 lb (74.4 kg)    GENERAL:  well developed, well nourished, not in acute distress HEENT: normocephalic, pink conjunctivae, anicteric sclerae, no xanthelasma, normal dentition, oropharynx clear NECK:  no neck vein engorgement, JVP normal, no hepatojugular reflux, carotid upstroke brisk and symmetric, no bruit, no  thyromegaly, no lymphadenopathy LUNGS:  good respiratory effort, clear to auscultation bilaterally CV:  PMI not displaced, no thrills, no lifts, S1 and S2 within normal limits, no palpable S3 or S4, no murmurs, no rubs, no gallops ABD:  Soft, nontender, nondistended, normoactive bowel sounds, no abdominal aortic bruit, no hepatomegaly, no splenomegaly MS: nontender back, no kyphosis, no scoliosis, no joint deformities EXT:  2+ DP/PT pulses, no edema, no varicosities, no cyanosis, no clubbing SKIN: warm, nondiaphoretic, normal turgor, no ulcers NEUROPSYCH: alert, oriented to person, place, and time, sensory/motor grossly intact, normal mood, appropriate affect  Recent Labs: 03/04/2016: Hemoglobin 16.0; Platelets 189; TSH 1.57 03/05/2016: ALT 19; BUN 20; Creatinine, Ser 0.96; Potassium 5.4; Sodium 142   Lipid Panel    Component Value Date/Time   CHOL 118 (L) 01/30/2016 0857   TRIG 60 01/30/2016 0857   HDL 49 01/30/2016 0857   CHOLHDL 2.4 01/30/2016 0857   VLDL 12 01/30/2016 0857   LDLCALC 57 01/30/2016 0857     Other studies Reviewed:  EKG:  The ekg from 03/06/2016 was personally reviewed by me and it revealed sinus rhythm, 83 BPM, right bundle branch block.  Additional studies/ records that were reviewed personally reviewed by me today include:  Procedure date:  08/11/2001  Findings:       ANGIOGRAPHY:  The left ventriculogram demonstrated a large anteroapical wall motion abnormality with mild anteroapical dyskinesis and anterolateral akinesis.  The calculated ejection fraction was 47%.  Visually, it appeared in the range of 30-35%.  Left coronary calcification was seen.  There was no significant mitral regurgitation.  There was a right dominant coronary system present.  The main left coronary artery was normal.  The left anterior descending artery and its branches were highly diseased. The vessel was completely occluded in the mid portion just after the origin of a small  septal perforator.  There was spontaneous reperfusion during angiography and this revealed a subtotal stenosis at the origin of the septal perforator and a thrombus burden within the vessel approximately 2 mm beyond the near complete occlusion.  There was TIMI-1 flow at this point.  The left circumflex coronary artery and its branches were without significant obstruction.  There were luminal irregularities.  The right coronary artery and its branches were without significant obstruction.  There was a 20-30% mid to distal stenosis.  It gave rise to a moderate sized posterior descending artery and a small posterolateral branch and segment.  Collateral vessels were seen from the distal PDA to the LAD, but these were quite trivial in nature.  Following balloon dilatation and stent implantation, there was no residual stenosis in the LAD and TIMI grade flow was II.  FINAL IMPRESSIONS: 1. Atherosclerotic coronary vascular disease,  single-vessel. 2. Moderately diminished left ventricular systolic function with regional    wall motion abnormality, as noted. 3. Status post successful percutaneous transluminal coronary angiography and    stent implantation to mid left anterior descending artery. 4. Systemic hypertension. 5. Elevated left ventricular end-diastolic pressure.  EKG  Procedure date:  11/01/2008  Findings:      sinus rhythm, rate 71, incomplete right bundle branch block, old anterior infarct, no acute ST-T wave changes.   Nuclear stress test 11/07/2008: Low risk nuclear study Evidence for anterior (septal apical) infarct  Overall Impression Exercise Capacity: Adenosine study with no exercise. ECG Impression: Baseline: NSR; No significant ST segment change with adenosine. Overall Impression: Low risk stress nuclear study. Overall Impression Comments: Mild fixed defect in anteroseptal wall. No signifcant ischemia. EF 47% with septal and inferior HK.   ASSESSMENT  AND PLAN:  CAD s/p PCI to mid LAD, in the setting of AMI, 08/11/2001 Patient denies chest pain shortness of breath. He is not physically active however. Agree with medical therapy: Aspirin, Plavix, beta blocker, ACE inhibitor, statin therapy. Recommend echocardiogram.  Right bundle branch block Noted to have incomplete/right bundle branch block in the past based on medical records. No available EKG for review. Recommend echo.  HTN BP is well controlled. Continue monitoring BP. Continue current medical therapy and lifestyle changes.   Hyperlipidemia LDL goal is less than 70 due to CAD. PCP following labs.  Tobacco use We discussed the importance of smoking cessation and different strategies for quitting.   Current medicines are reviewed at length with the patient today.  The patient does not have concerns regarding medicines.  Labs/ tests ordered today include:  Orders Placed This Encounter  Procedures  . EKG 12-Lead  . ECHOCARDIOGRAM COMPLETE    I had a lengthy and detailed discussion with the patient regarding diagnoses, prognosis, diagnostic options, treatment options , and side effects of medications.   I counseled the patient on importance of lifestyle modification including heart healthy diet, regular physical activity , and smoking cessation.  I spent at least 45 minutes with the patient today and more than 50% of the time was spent counseling the patient and coordinating care.     Disposition:   FU with undersigned In 6 months time  Signed, Wende Bushy, MD  03/06/2016 11:32 AM    [place]  This note was generated in part with voice recognition software and I apologize for any typographical errors that were not detected and corrected.

## 2016-03-07 ENCOUNTER — Other Ambulatory Visit: Payer: Self-pay

## 2016-03-07 ENCOUNTER — Ambulatory Visit (INDEPENDENT_AMBULATORY_CARE_PROVIDER_SITE_OTHER): Payer: Medicare Other

## 2016-03-07 DIAGNOSIS — I252 Old myocardial infarction: Secondary | ICD-10-CM | POA: Diagnosis not present

## 2016-03-07 DIAGNOSIS — I2510 Atherosclerotic heart disease of native coronary artery without angina pectoris: Secondary | ICD-10-CM | POA: Diagnosis not present

## 2016-03-08 ENCOUNTER — Ambulatory Visit (INDEPENDENT_AMBULATORY_CARE_PROVIDER_SITE_OTHER): Payer: Medicare Other | Admitting: Urology

## 2016-03-08 ENCOUNTER — Encounter: Payer: Self-pay | Admitting: Urology

## 2016-03-08 VITALS — BP 134/77 | HR 73 | Ht 68.0 in | Wt 158.6 lb

## 2016-03-08 DIAGNOSIS — R310 Gross hematuria: Secondary | ICD-10-CM

## 2016-03-08 LAB — URINALYSIS, COMPLETE
Bilirubin, UA: NEGATIVE
Glucose, UA: NEGATIVE
Ketones, UA: NEGATIVE
LEUKOCYTES UA: NEGATIVE
Nitrite, UA: NEGATIVE
PH UA: 7 (ref 5.0–7.5)
Protein, UA: NEGATIVE
Specific Gravity, UA: <1.005 — ABNORMAL LOW (ref 1.005–1.030)
Urobilinogen, Ur: 0.2 mg/dL (ref 0.2–1.0)

## 2016-03-08 LAB — MICROSCOPIC EXAMINATION
BACTERIA UA: NONE SEEN
WBC, UA: NONE SEEN /hpf (ref 0–?)

## 2016-03-08 NOTE — Progress Notes (Signed)
11:19 AM  03/08/16   Bruce Gordon [birth year] [id number]  Referring provider: Bobetta Lime, MD [address] [place], [place] [address]  Chief Complaint  Patient presents with  . Follow-up    hematuria  PSA done 03/04/16    HPI: 75 year old male with prostate cancer s/p EBRT, history of gross hematuria, bilateral non obstructing stones.  Since last visit, he unfortunately lost his wife.    History of prostate cancer  Dx Gleason 3+4 = 7 prostate cancer in 2/12 cores with a pretreatment PSA of 5.3, DRE was asymmetric, TRUS volume was 36 mL. S/p EBRT completed in February 2016.    He followeded up with Dr. Baruch Gouty in Feb 2017.  PSA trend 0.76, repeat 1.1 04/2015, 1.8 09/2015, and most recently 0.85 on 02/2016  Recent weight loss, 35 lbs in 6 months.  Chest CT shows small pulmonary nodues.  Scheduled for PET scan for further work.    Gross hematuria S/p negative cysto on 08/2015  CT Urogram from 09/01/15 showed bilateral nonobstructing renal calculi up to 7 mm (asymptomatic, no previous history of flank pain/ passing stones) and renal cysts but no suspicious masses.     He is on plavix/ ASA.   He smokes 1 ppd x 50+ years.   He is actively cutting back.  No hematuria since last visit.    Kidney stones As above, incidental on CT Urogram  PMH: Past Medical History:  Diagnosis Date  . Aortic atherosclerosis ([place]) 12/20/2015  . Arthritis   . Arthritis, multiple joint involvement   . Asthma   . Chronic pain of right knee   . Cigarette smoker   . COPD, severe ([place])   . Coronary artery disease involving native coronary 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 angina pectoris 2002  . Elevated PSA   . Herpes zoster without complication   . History of myocardial infarction   . HTN, goal below 150/90   . Hypertriglyceridemia   . Obstructive sleep apnea of adult   . Osteopenia determined by x-ray 12/20/2015   Chest CT May 2017  . Primary prostate cancer ([place])   .  Prostate cancer ([place])   . Pulmonary nodules 12/20/2015   Chest CT May 2017  . Sleep apnea     Surgical History: Past Surgical History:  Procedure Laterality Date  . APPENDECTOMY    . CORONARY ANGIOPLASTY WITH STENT PLACEMENT  December 2002    Home Medications:    Medication List       Accurate as of 03/08/16 11:19 AM. Always use your most recent med list.          aspirin 81 MG tablet Take 81 mg by mouth daily.   atorvastatin 20 MG tablet Commonly known as:  LIPITOR Take 0.5 tablets (10 mg total) by mouth at bedtime.   clopidogrel 75 MG tablet Commonly known as:  PLAVIX TAKE 1 TABLET BY MOUTH EVERY DAY   lisinopril 10 MG tablet Commonly known as:  PRINIVIL,ZESTRIL Take 0.5 tablets (5 mg total) by mouth daily. For blood pressure   metoprolol succinate 50 MG 24 hr tablet Commonly known as:  TOPROL-XL Take 1 tablet (50 mg total) by mouth daily. Take with or immediately following a meal.   niacin 1000 MG CR tablet Commonly known as:  NIASPAN Take 1 tablet (1,000 mg total) by mouth daily.   SPIRIVA HANDIHALER 18 MCG inhalation capsule Generic drug:  tiotropium PLACE 1 CAPSULE (18 MCG TOTAL) INTO INHALER AND INHALE DAILY.  Allergies: No Known Allergies  Family History: Family History  Problem Relation Age of Onset  . Hypertension Father   . Aneurysm Brother     Social History:  reports that he has been smoking Pipe and Cigarettes.  He has been smoking about 0.50 packs per day. He has never used smokeless tobacco. He reports that he does not drink alcohol or use drugs.   Bruce Gordon  Physical Exam: BP 134/77 (BP Location: Right Arm, Patient Position: Sitting, Cuff Size: Normal)   Pulse 73   Ht 5\' 8"  (1.727 m)   Wt 158 lb 9.6 oz (71.9 kg)   BMI 24.12 kg/m   Constitutional:  Alert and oriented, No acute distress.   Smells like tobacco.   HEENT: [place] AT, moist mucus membranes.  Trachea midline, no masses. Respiratory: Normal respiratory effort, no  increased work of breathing. GI: Abdomen is soft, nontender, nondistended, no abdominal masses GU: No CVA tenderness. Circumcised phallus with subcoronal hypospadias noted.  Skin: No rashes, bruises or suspicious lesions. Neurologic: Grossly intact, no focal deficits, moving all 4 extremities. Psychiatric: Normal mood and affect.  Laboratory Data: Lab Results  Component Value Date   WBC 6.4 03/04/2016   HGB 16.0 03/04/2016   HCT 46.6 03/04/2016   MCV 96.9 03/04/2016   PLT 189 03/04/2016   BMP Latest Ref Rng & Units 03/05/2016 03/04/2016 12/08/2015  Glucose 65 - 99 mg/dL 90 82 96  BUN 6 - 20 mg/dL 20 19 17   Creatinine 0.61 - 1.24 mg/dL 0.96 1.10 1.05  BUN/Creat Ratio 10 - 22 - - -  Sodium 135 - 145 mmol/L 142 140 137  Potassium 3.5 - 5.1 mmol/L 5.4(H) 5.7(H) 4.2  Chloride 101 - 111 mmol/L 105 104 104  CO2 22 - 32 mmol/L 30 27 24   Calcium 8.9 - 10.3 mg/dL 9.6 9.5 9.2     Lab Results  Component Value Date   PSA 0.85 03/04/2016   PSA 1.85 09/15/2015   PSA 0.76 03/06/2015   Urinalysis  Reviewed, negative today for microscopic hematuria  Assessment & Plan:     1. Prostate cancer [facility]) S/p XRT, followed by radiation oncology and urology PSA stable No voiding complaints today or gross hematuria F/u in 1 year  2. Renal calculi Incidental bilateral renal calculi, largest 7 mm. Asymptomatic and no history of previous stone episodes. Will continue to follow.  3. Hypospadias, penile Stands to void. Asymptomatic.  No intervention necessary.   Return in about 1 year (around 03/08/2017) for UA/ smyptoms check.  Hollice Espy, MD  [facility] [address], [place], [place] [address] [phone]

## 2016-03-12 ENCOUNTER — Telehealth: Payer: Self-pay | Admitting: Family Medicine

## 2016-03-12 NOTE — Telephone Encounter (Signed)
done

## 2016-03-13 ENCOUNTER — Ambulatory Visit
Admission: RE | Admit: 2016-03-13 | Discharge: 2016-03-13 | Disposition: A | Discharge status: Home / Self Care | Payer: Medicare Other | Source: Ambulatory Visit | Attending: Family Medicine | Admitting: Family Medicine

## 2016-03-13 DIAGNOSIS — Z8546 Personal history of malignant neoplasm of prostate: Secondary | ICD-10-CM | POA: Insufficient documentation

## 2016-03-13 DIAGNOSIS — C61 Malignant neoplasm of prostate: Secondary | ICD-10-CM | POA: Diagnosis not present

## 2016-03-13 DIAGNOSIS — J439 Emphysema, unspecified: Secondary | ICD-10-CM | POA: Diagnosis not present

## 2016-03-13 DIAGNOSIS — I2510 Atherosclerotic heart disease of native coronary artery without angina pectoris: Secondary | ICD-10-CM | POA: Diagnosis not present

## 2016-03-13 DIAGNOSIS — N200 Calculus of kidney: Secondary | ICD-10-CM | POA: Insufficient documentation

## 2016-03-13 DIAGNOSIS — N281 Cyst of kidney, acquired: Secondary | ICD-10-CM | POA: Diagnosis not present

## 2016-03-13 DIAGNOSIS — R911 Solitary pulmonary nodule: Secondary | ICD-10-CM | POA: Diagnosis not present

## 2016-03-13 DIAGNOSIS — R634 Abnormal weight loss: Secondary | ICD-10-CM | POA: Diagnosis not present

## 2016-03-13 LAB — GLUCOSE, CAPILLARY: GLUCOSE-CAPILLARY: 86 mg/dL (ref 65–99)

## 2016-03-13 MED ORDER — FLUDEOXYGLUCOSE F - 18 (FDG) INJECTION
11.5300 | Freq: Once | INTRAVENOUS | Status: AC | PRN
  Administered 2016-03-13: 11.53 via INTRAVENOUS

## 2016-03-15 ENCOUNTER — Telehealth: Payer: Self-pay | Admitting: Family Medicine

## 2016-03-15 NOTE — Telephone Encounter (Signed)
Pt called wanting results of PET scan?

## 2016-03-19 ENCOUNTER — Other Ambulatory Visit: Payer: Self-pay | Admitting: Family Medicine

## 2016-03-19 NOTE — Telephone Encounter (Signed)
Last lipids and sgpt reviewed 6 month supply of Rx sent to pharmacy in June; should not be needed; note sent to pharmacy

## 2016-03-30 ENCOUNTER — Other Ambulatory Visit: Payer: Self-pay | Admitting: Family Medicine

## 2016-03-30 DIAGNOSIS — E875 Hyperkalemia: Secondary | ICD-10-CM

## 2016-03-31 DIAGNOSIS — E875 Hyperkalemia: Secondary | ICD-10-CM | POA: Insufficient documentation

## 2016-03-31 NOTE — Assessment & Plan Note (Signed)
Recheck K+ on lower dose of ACE-I

## 2016-03-31 NOTE — Telephone Encounter (Signed)
Last lipids and sgpt reviewed; Rx was changed in June and I sent a 6 month supply; should not be needed ----------------------------- Reviewed other labs, notes; entered orders for K+ recheck Jamie, Please ask patient to have potassium rechecked this week; thank you

## 2016-04-01 NOTE — Telephone Encounter (Signed)
Left voice mail

## 2016-04-02 ENCOUNTER — Other Ambulatory Visit: Payer: Medicare Other

## 2016-04-02 ENCOUNTER — Other Ambulatory Visit: Payer: Self-pay

## 2016-04-02 DIAGNOSIS — E875 Hyperkalemia: Secondary | ICD-10-CM | POA: Diagnosis not present

## 2016-04-02 LAB — POTASSIUM: POTASSIUM: 5.1 mmol/L (ref 3.5–5.3)

## 2016-04-03 ENCOUNTER — Other Ambulatory Visit: Payer: Self-pay

## 2016-04-03 ENCOUNTER — Other Ambulatory Visit: Payer: Self-pay | Admitting: *Deleted

## 2016-04-03 DIAGNOSIS — I2510 Atherosclerotic heart disease of native coronary artery without angina pectoris: Secondary | ICD-10-CM

## 2016-04-03 MED ORDER — CLOPIDOGREL BISULFATE 75 MG PO TABS: 75.0000 mg | ORAL_TABLET | Freq: Every day | ORAL | 3 refills | Status: AC

## 2016-04-03 MED ORDER — LISINOPRIL 10 MG PO TABS: 5.0000 mg | ORAL_TABLET | Freq: Every day | ORAL | 0 refills | Status: AC

## 2016-04-03 NOTE — Telephone Encounter (Signed)
Ins. Wants 90 day supply?

## 2016-04-04 ENCOUNTER — Ambulatory Visit: Payer: Medicare Other | Admitting: Family Medicine

## 2016-04-05 ENCOUNTER — Inpatient Hospital Stay: Payer: Medicare Other

## 2016-04-05 ENCOUNTER — Ambulatory Visit: Payer: Medicare Other | Admitting: Radiation Oncology

## 2016-04-09 ENCOUNTER — Telehealth: Payer: Self-pay | Admitting: *Deleted

## 2016-04-09 ENCOUNTER — Ambulatory Visit (INDEPENDENT_AMBULATORY_CARE_PROVIDER_SITE_OTHER): Payer: Medicare Other | Admitting: Family Medicine

## 2016-04-09 ENCOUNTER — Encounter: Payer: Self-pay | Admitting: Family Medicine

## 2016-04-09 VITALS — BP 130/80 | HR 72 | Temp 97.7°F | Resp 16 | Wt 166.5 lb

## 2016-04-09 DIAGNOSIS — E875 Hyperkalemia: Secondary | ICD-10-CM | POA: Diagnosis not present

## 2016-04-09 DIAGNOSIS — R918 Other nonspecific abnormal finding of lung field: Secondary | ICD-10-CM

## 2016-04-09 DIAGNOSIS — Z23 Encounter for immunization: Secondary | ICD-10-CM | POA: Diagnosis not present

## 2016-04-09 DIAGNOSIS — I2510 Atherosclerotic heart disease of native coronary artery without angina pectoris: Secondary | ICD-10-CM | POA: Diagnosis not present

## 2016-04-09 DIAGNOSIS — N521 Erectile dysfunction due to diseases classified elsewhere: Secondary | ICD-10-CM | POA: Diagnosis not present

## 2016-04-09 DIAGNOSIS — R634 Abnormal weight loss: Secondary | ICD-10-CM | POA: Diagnosis not present

## 2016-04-09 DIAGNOSIS — N529 Male erectile dysfunction, unspecified: Secondary | ICD-10-CM | POA: Insufficient documentation

## 2016-04-09 NOTE — Assessment & Plan Note (Signed)
Resolved, and patient is regaining weight now; hx of prostate cancer, lung nodules, reviewed recent labs and CT and PET scan with him; he is aware of possibility of lung cancer, and will f/u with cancer center navigator for next scan; he sees radiation oncologist tomorrow; pt was advised to keep an eye on his health and notify me if losing weight again or any new symptoms appear (dark mole, hemoptysis, etc); he agrees

## 2016-04-09 NOTE — Assessment & Plan Note (Signed)
Discussed treatment options for ED including PDE-5 inhibitors, Muse, Caverject, pumps, etc; he did not want to try anything at this time; he is welcome to discuss with Dr. Erlene Quan or call; he does not use any nitrates

## 2016-04-09 NOTE — Telephone Encounter (Signed)
Contacted patient to discuss recent CT and PET scan results with plan of short term follow up imaging either in the form of lung cancer CT screening scan or other CT scan. Patient is agreeable to this plan and will contact me for any question. Will follow up in Jan/Feb for imaging planning.

## 2016-04-09 NOTE — Assessment & Plan Note (Signed)
Dose of ACE-I reduced and K+ back to normal

## 2016-04-09 NOTE — Assessment & Plan Note (Signed)
Patient is plugged in to the cancer center's nurse navigator, will have f/u there in a few months

## 2016-04-09 NOTE — Patient Instructions (Addendum)
Let me or Dr. Erlene Quan know if we can help with medicine for the ED Return in December Notify me if losing weight or new symptoms appear

## 2016-04-09 NOTE — Progress Notes (Signed)
BP 130/80   Pulse 72   Temp 97.7 F (36.5 C) (Oral)   Resp 16   Wt 166 lb 8 oz (75.5 kg)   SpO2 96%   BMI 25.32 kg/m    Subjective:    Patient ID: Bruce Gordon, male    DOB: 10/25/1940, 75 y.o.   MRN: [id number]  HPI: GURVIS SASO is a 75 y.o. male  Chief Complaint  Patient presents with  . Follow-up    weight loss   Patient here for f/u of weight loss Gaining weight since last visit Weight is up 8 pounds since last visit at urologist He is eating like a pig, he jokes; eating better; getting carbs and protein, fruits Patient eats out every two weeks; he is doing all the cooking Does not have a pet; not interested in counseling Adopted son is a Company secretary; officiated burial of her ashes; he lives in [place] He is going to see someone at the cancer center for another PET scan first of the year; 10 mm right lung nodule; he is aware of possibility of low grade cancer; Burgess Estelle is managing and coordinating f/u for the abnormal chest CT and PET scans He sees urologist next year for his prostate cancer, and sees Dr. Oren Bracket radiation oncology tomorrow He has problems with ED; he talked to Dr. Erlene Quan and she told him to clean out his pipes regularly; he says he doesn't have any lead in his pencil, and with his wife's passing last month, he doesn't have anyone to write to anyway Sees Dr. Yvone Neu; no chest pain; follows with cardiologist every six months  PET scan 03/13/16: CLINICAL DATA:  Initial treatment strategy for prostate cancer.  EXAM: NUCLEAR MEDICINE PET SKULL BASE TO THIGH  TECHNIQUE: 11.5 mCi F-18 FDG was injected intravenously. Full-ring PET imaging was performed from the skull base to thigh after the radiotracer. CT data was obtained and used for attenuation correction and anatomic localization.  FASTING BLOOD GLUCOSE:  Value: unknown. Mg/dl  COMPARISON:  Chest CT 12/18/2015.  Abdomen and pelvis CT 09/01/2015.  FINDINGS: NECK  No  hypermetabolic lymph nodes in the neck.  CHEST  No hypermetabolic mediastinal or hilar nodes. 10 mm nodule along the minor fissure (image 107 series 3) shows no hypermetabolism. The of the tiny pulmonary nodules described on the previous diagnostic CT chest Sir more difficult to localize on this non breath hold exam. Underlying emphysema again noted.  ABDOMEN/PELVIS  No abnormal hypermetabolic activity within the liver, pancreas, adrenal glands, or spleen. No hypermetabolic lymph nodes in the abdomen or pelvis.  Bilateral renal cysts with bilateral nephrolithiasis. Coronary artery calcification is evident. Mild circumferential bladder wall thickening noted. Right inguinal hernia contains only fat. Diverticular changes noted in the left colon without diverticulitis.  SKELETON  No focal hypermetabolic activity to suggest skeletal metastasis.  IMPRESSION: 1. No evidence for hypermetabolism in the 10 mm right lung nodule. Well differentiated or low-grade neoplasm can be poorly FDG avid in continued attention to this nodule recommended. 2. Coronary artery calcification. 3. Emphysema. 4. Bilateral renal cysts with nephrolithiasis. No definite hypermetabolic lesion in either kidney.  Electronically Signed   By: Misty Stanley M.D.   On: 03/13/2016 15:03  Depression screen [facility] 2/9 03/04/2016 12/06/2015 03/20/2015  Decreased Interest 0 0 0  Down, Depressed, Hopeless 0 1 0  PHQ - 2 Score 0 1 0   Relevant past medical, surgical, family and social history reviewed Past Medical History:  Diagnosis Date  .  Aortic atherosclerosis ([place]) 12/20/2015  . Arthritis   . Arthritis, multiple joint involvement   . Asthma   . Chronic pain of right knee   . Cigarette smoker   . COPD, severe ([place])   . Coronary artery disease involving native coronary 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 angina pectoris 2002  . Elevated PSA   . Herpes zoster without complication   . History of myocardial  infarction   . HTN, goal below 150/90   . Hypertriglyceridemia   . Obstructive sleep apnea of adult   . Osteopenia determined by x-ray 12/20/2015   Chest CT May 2017  . Primary prostate cancer ([place])   . Prostate cancer ([place])   . Pulmonary nodules 12/20/2015   Chest CT May 2017  . Sleep apnea    Past Surgical History:  Procedure Laterality Date  . APPENDECTOMY    . CORONARY ANGIOPLASTY WITH STENT PLACEMENT  December 2002   Family History  Problem Relation Age of Onset  . Hypertension Father   . Aneurysm Brother    Social History  Substance Use Topics  . Smoking status: Current Every Day Smoker    Packs/day: 0.50    Types: Pipe, Cigarettes  . Smokeless tobacco: Never Used  . Alcohol use No   Interim medical history since last visit reviewed. Allergies and medications reviewed  Review of Systems  Cardiovascular: Negative for leg swelling.  Genitourinary:       Erectile dysfunction   Per HPI unless specifically indicated above     Objective:    BP 130/80   Pulse 72   Temp 97.7 F (36.5 C) (Oral)   Resp 16   Wt 166 lb 8 oz (75.5 kg)   SpO2 96%   BMI 25.32 kg/m   Wt Readings from Last 3 Encounters:  04/09/16 166 lb 8 oz (75.5 kg)  03/08/16 158 lb 9.6 oz (71.9 kg)  03/06/16 160 lb 8 oz (72.8 kg)    Physical Exam  Constitutional: He appears well-developed and well-nourished. No distress.  Weight back up, 8 pounds over last one month  Eyes: No scleral icterus.  Neck: No JVD present.  Cardiovascular: Normal rate and regular rhythm.   Pulmonary/Chest: Effort normal and breath sounds normal. He has no wheezes.  Abdominal: Soft. Bowel sounds are normal. He exhibits no distension. There is no tenderness.  Skin: Skin is warm and dry. Ecchymosis (sun-exposed parts of the arms) noted. No pallor.  Psychiatric: He has a normal mood and affect. His mood appears not anxious. He is not agitated and not slowed. Cognition and memory are not impaired. He does not express  impulsivity. He does not exhibit a depressed mood. He expresses no homicidal and no suicidal ideation.  Good eye contact with examiner He is attentive.   Results for orders placed or performed in visit on 04/02/16  Potassium  Result Value Ref Range   Potassium 5.1 3.5 - 5.3 mmol/L      Assessment & Plan:   Problem List Items Addressed This Visit      Cardiovascular and Mediastinum   Coronary artery disease involving native coronary 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 angina pectoris    Followed by cardiologist        Genitourinary   Erectile dysfunction    Discussed treatment options for ED including PDE-5 inhibitors, Muse, Caverject, pumps, etc; he did not want to try anything at this time; he is welcome to discuss with Dr. Erlene Quan or call; he does not use any  nitrates        Other   Weight loss, unintentional - Primary    Resolved, and patient is regaining weight now; hx of prostate cancer, lung nodules, reviewed recent labs and CT and PET scan with him; he is aware of possibility of lung cancer, and will f/u with cancer center navigator for next scan; he sees radiation oncologist tomorrow; pt was advised to keep an eye on his health and notify me if losing weight again or any new symptoms appear (dark mole, hemoptysis, etc); he agrees      Pulmonary nodules    Patient is plugged in to the cancer center's nurse navigator, will have f/u there in a few months      Hyperkalemia    Dose of ACE-I reduced and K+ back to normal       Other Visit Diagnoses    Needs flu shot       Relevant Orders   Flu vaccine HIGH DOSE PF (Fluzone High dose) (Completed)      Follow up plan: No Follow-up on file. -- keep appt in Dec  An after-visit summary was printed and given to the patient at [facility].  Please see the patient instructions which may contain other information and recommendations beyond what is mentioned above in the assessment and plan.  No orders of the defined types were  placed in this encounter.   Orders Placed This Encounter  Procedures  . Flu vaccine HIGH DOSE PF (Fluzone High dose)

## 2016-04-09 NOTE — Assessment & Plan Note (Signed)
Followed by cardiologist 

## 2016-04-10 ENCOUNTER — Encounter: Payer: Self-pay | Admitting: Radiation Oncology

## 2016-04-10 ENCOUNTER — Inpatient Hospital Stay: Payer: Medicare Other

## 2016-04-10 ENCOUNTER — Ambulatory Visit
Admission: RE | Admit: 2016-04-10 | Discharge: 2016-04-10 | Disposition: A | Discharge status: Home / Self Care | Payer: Medicare Other | Source: Ambulatory Visit | Attending: Radiation Oncology | Admitting: Radiation Oncology

## 2016-04-10 VITALS — BP 148/79 | HR 71 | Temp 95.4°F | Resp 20 | Wt 166.6 lb

## 2016-04-10 DIAGNOSIS — Z8546 Personal history of malignant neoplasm of prostate: Secondary | ICD-10-CM | POA: Diagnosis present

## 2016-04-10 DIAGNOSIS — Z923 Personal history of irradiation: Secondary | ICD-10-CM | POA: Insufficient documentation

## 2016-04-10 DIAGNOSIS — R918 Other nonspecific abnormal finding of lung field: Secondary | ICD-10-CM | POA: Diagnosis not present

## 2016-04-10 DIAGNOSIS — Z08 Encounter for follow-up examination after completed treatment for malignant neoplasm: Secondary | ICD-10-CM | POA: Diagnosis not present

## 2016-04-10 DIAGNOSIS — C61 Malignant neoplasm of prostate: Secondary | ICD-10-CM

## 2016-04-10 NOTE — Progress Notes (Signed)
Radiation Oncology Follow up Note  Name: Bruce Gordon   Date:   04/10/2016 MRN:  [id number] DOB: Aug 03, 1941    This 75 y.o. male presents to the clinic today for follow-up for prostate cancer now 2 years out.  REFERRING PROVIDER: Bobetta Lime, MD  HPI: Patient is a 75 year old male now out 2 years having completed IM RT radiation therapy for a Gleason 7 adenocarcinoma of the prostate image guided. Seen today in routine follow-up he is doing well. He specifically denies any increased lower urinary tract symptoms diarrhea or fatigue last PSA in July was 0.85.Marland Kitchen He had a PET scan earlier this month which showed no evidence of any hypermetabolic areas there is a 10 mm right lung nodule with low FDG level. That area is being followed.  COMPLICATIONS OF TREATMENT: none  FOLLOW UP COMPLIANCE: keeps appointments   PHYSICAL EXAM:  BP (!) 148/79   Pulse 71   Temp (!) 95.4 F (35.2 C)   Resp 20   Wt 166 lb 8.9 oz (75.5 kg)   BMI 25.32 kg/m  On rectal exam rectal sphincter tone is good. Prostate is smooth contracted without evidence of nodularity or mass. Sulcus is preserved bilaterally. No discrete nodularity is identified. No other rectal abnormalities are noted. Well-developed well-nourished patient in NAD. HEENT reveals PERLA, EOMI, discs not visualized.  Oral cavity is clear. No oral mucosal lesions are identified. Neck is clear without evidence of cervical or supraclavicular adenopathy. Lungs are clear to A&P. Cardiac examination is essentially unremarkable with regular rate and rhythm without murmur rub or thrill. Abdomen is benign with no organomegaly or masses noted. Motor sensory and DTR levels are equal and symmetric in the upper and lower extremities. Cranial nerves II through XII are grossly intact. Proprioception is intact. No peripheral adenopathy or edema is identified. No motor or sensory levels are noted. Crude visual fields are within normal range.  RADIOLOGY RESULTS: PET  scan is reviewed and compatible with the above-stated findings  PLAN: Present time patient is doing well from a prostate cancer standpoint with no evidence of disease. I have asked to see him back in 1 year for follow-up. He will continue close follow-up care with urology. Patient is to call with any concerns.  I would like to take this opportunity to thank you for allowing me to participate in the care of your patient.Armstead Peaks., MD

## 2016-05-23 DIAGNOSIS — I10 Essential (primary) hypertension: Secondary | ICD-10-CM | POA: Diagnosis not present

## 2016-05-30 ENCOUNTER — Telehealth: Payer: Self-pay | Admitting: Family Medicine

## 2016-05-30 DIAGNOSIS — E785 Hyperlipidemia, unspecified: Secondary | ICD-10-CM

## 2016-05-30 MED ORDER — NIACIN ER (ANTIHYPERLIPIDEMIC) 500 MG PO TBCR: 500.0000 mg | EXTENDED_RELEASE_TABLET | Freq: Every day | ORAL | 0 refills | Status: AC

## 2016-05-30 NOTE — Assessment & Plan Note (Signed)
Recheck lipids after decreasing niaspan

## 2016-05-30 NOTE — Telephone Encounter (Signed)
Patient is currently on niaspan and atorvastatin These were started years ago, I'm sure I'd like to decrease his niacin dose from 1000 mg to 500 mg and then recheck lipids 6 weeks later I'll also be eager to see if the cardiologist wants him to stop this niacin altogether I referred to a heart doctor in June, but don't have any notes back yet; please f/u on referral and/or request notes Thank you

## 2016-06-05 ENCOUNTER — Other Ambulatory Visit: Payer: Self-pay

## 2016-06-05 ENCOUNTER — Telehealth: Payer: Self-pay

## 2016-06-05 NOTE — Telephone Encounter (Signed)
FYI Patient has moved out of state and is working on getting new PCP and cardiologist.  He was notified about niacin decrease and will get pharmacy to transfer.  He also wanted to let you know blood pressure was running high and he went to urgent care and they started him on Clonidine .1 mg 1 tab bid prn.

## 2016-06-05 NOTE — Telephone Encounter (Signed)
Phone disconnected sending letter to call

## 2016-06-06 ENCOUNTER — Other Ambulatory Visit: Payer: Self-pay

## 2016-06-06 ENCOUNTER — Telehealth: Payer: Self-pay

## 2016-06-06 ENCOUNTER — Encounter: Payer: Self-pay | Admitting: Family Medicine

## 2016-06-06 NOTE — Telephone Encounter (Signed)
Thank you Please update the med list Let him know we wish him well; he is always welcome to return to our clinic if he moves back

## 2016-06-07 ENCOUNTER — Other Ambulatory Visit: Payer: Self-pay

## 2016-06-14 NOTE — Telephone Encounter (Signed)
error 

## 2016-08-02 ENCOUNTER — Ambulatory Visit: Payer: Medicare Other | Admitting: Family Medicine

## 2016-09-02 ENCOUNTER — Telehealth: Payer: Self-pay | Admitting: *Deleted

## 2016-09-02 NOTE — Telephone Encounter (Signed)
Contacted patient to discuss follow up of lung nodule. Patient has moved to [place] and has established a new PCP there. Patient made aware of the importance of follow up of lung nodule and eligibility of lung screening scan. My contact number is given for any further questions or concerns.

## 2016-09-13 ENCOUNTER — Ambulatory Visit: Payer: Medicare Other | Admitting: Radiation Oncology

## 2016-09-13 ENCOUNTER — Other Ambulatory Visit: Payer: Medicare Other

## 2017-03-07 ENCOUNTER — Ambulatory Visit: Payer: Medicare Other | Admitting: Urology

## 2017-10-31 IMAGING — CT NM PET TUM IMG INITIAL (PI) SKULL BASE T - THIGH
1 of 9 series · 1 of 25 positions shown · non-contrast
Comparison: Chest CT 12/18/2015.  Abdomen and pelvis CT 09/01/2015.

CLINICAL DATA: Initial treatment strategy for prostate cancer.

EXAM:
NUCLEAR MEDICINE PET SKULL BASE TO THIGH
TECHNIQUE: 11.5 mCi F-18 FDG was injected intravenously. Full-ring PET imaging
was performed from the skull base to thigh after the radiotracer. CT
data was obtained and used for attenuation correction and anatomic
localization.
FASTING BLOOD GLUCOSE:  Value: unknown. Mg/dl

[Series 3: ct wb 5.0 b30f · axial · 5.0mm · 0.98mm/px · 1 of 290 slices shown]
[im 290/290  brain]
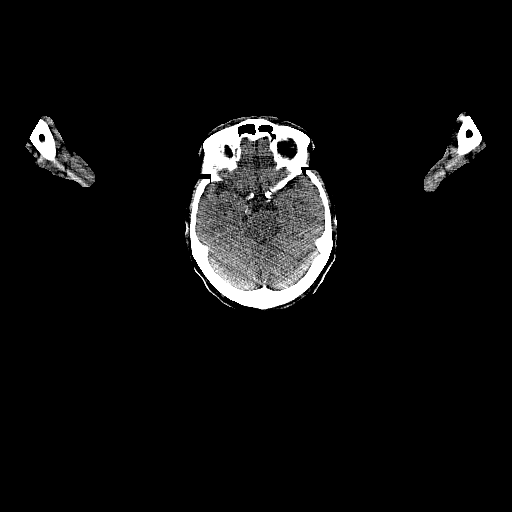

[1 of 25 positions shown; findings below may reference images not displayed]

FINDINGS: NECK

No hypermetabolic lymph nodes in the neck.

CHEST

No hypermetabolic mediastinal or hilar nodes. 10 mm nodule along the
minor fissure (image 107 series 3) shows no hypermetabolism. The of
the tiny pulmonary nodules described on the previous diagnostic CT
chest [place] difficult to localize on this non breath hold exam.
Underlying emphysema again noted.

ABDOMEN/PELVIS

No abnormal hypermetabolic activity within the liver, pancreas,
adrenal glands, or spleen. No hypermetabolic lymph nodes in the
abdomen or pelvis.

Bilateral renal cysts with bilateral nephrolithiasis. Coronary
artery calcification is evident. Mild circumferential bladder wall
thickening noted. Right inguinal hernia contains only fat.
Diverticular changes noted in the left colon without diverticulitis.

SKELETON

No focal hypermetabolic activity to suggest skeletal metastasis.
IMPRESSION: 1. No evidence for hypermetabolism in the 10 mm right lung nodule.
Well differentiated or low-grade neoplasm can be poorly FDG avid in
continued attention to this nodule recommended.
2. Coronary artery calcification.
3. Emphysema.
4. Bilateral renal cysts with nephrolithiasis. No definite
hypermetabolic lesion in either kidney.
